# Patient Record
Sex: Female | Born: 1950 | Race: White | Hispanic: No | Smoking: Former smoker
Health system: Southern US, Community
[De-identification: ages and names within clinical notes are randomized; demographics above are authoritative.]

## PROBLEM LIST (undated history)

## (undated) DIAGNOSIS — M1990 Unspecified osteoarthritis, unspecified site: Secondary | ICD-10-CM

## (undated) DIAGNOSIS — T7840XA Allergy, unspecified, initial encounter: Secondary | ICD-10-CM

## (undated) DIAGNOSIS — Z973 Presence of spectacles and contact lenses: Secondary | ICD-10-CM

## (undated) DIAGNOSIS — M47812 Spondylosis without myelopathy or radiculopathy, cervical region: Secondary | ICD-10-CM

## (undated) DIAGNOSIS — K219 Gastro-esophageal reflux disease without esophagitis: Secondary | ICD-10-CM

## (undated) DIAGNOSIS — Z9889 Other specified postprocedural states: Secondary | ICD-10-CM

## (undated) DIAGNOSIS — E559 Vitamin D deficiency, unspecified: Secondary | ICD-10-CM

## (undated) DIAGNOSIS — I10 Essential (primary) hypertension: Secondary | ICD-10-CM

## (undated) DIAGNOSIS — I341 Nonrheumatic mitral (valve) prolapse: Secondary | ICD-10-CM

## (undated) DIAGNOSIS — K6289 Other specified diseases of anus and rectum: Secondary | ICD-10-CM

## (undated) DIAGNOSIS — M5030 Other cervical disc degeneration, unspecified cervical region: Secondary | ICD-10-CM

## (undated) DIAGNOSIS — Z8616 Personal history of COVID-19: Secondary | ICD-10-CM

## (undated) DIAGNOSIS — Z87820 Personal history of traumatic brain injury: Secondary | ICD-10-CM

## (undated) DIAGNOSIS — E042 Nontoxic multinodular goiter: Secondary | ICD-10-CM

## (undated) DIAGNOSIS — Z87898 Personal history of other specified conditions: Secondary | ICD-10-CM

## (undated) DIAGNOSIS — F419 Anxiety disorder, unspecified: Secondary | ICD-10-CM

## (undated) DIAGNOSIS — R112 Nausea with vomiting, unspecified: Secondary | ICD-10-CM

## (undated) DIAGNOSIS — K602 Anal fissure, unspecified: Secondary | ICD-10-CM

## (undated) DIAGNOSIS — K649 Unspecified hemorrhoids: Secondary | ICD-10-CM

## (undated) DIAGNOSIS — M359 Systemic involvement of connective tissue, unspecified: Secondary | ICD-10-CM

## (undated) HISTORY — PX: SUPRACERVICAL ABDOMINAL HYSTERECTOMY: SHX5393

## (undated) HISTORY — PX: COLONOSCOPY WITH PROPOFOL: SHX5780

## (undated) HISTORY — DX: Personal history of COVID-19: Z86.16

## (undated) HISTORY — DX: Anxiety disorder, unspecified: F41.9

## (undated) HISTORY — DX: Anal fissure, unspecified: K60.2

## (undated) HISTORY — DX: Vitamin D deficiency, unspecified: E55.9

## (undated) HISTORY — PX: UPPER GASTROINTESTINAL ENDOSCOPY: SHX188

## (undated) HISTORY — DX: Allergy, unspecified, initial encounter: T78.40XA

## (undated) HISTORY — DX: Essential (primary) hypertension: I10

## (undated) HISTORY — DX: Nonrheumatic mitral (valve) prolapse: I34.1

## (undated) HISTORY — PX: PELVIC FLOOR REPAIR: SHX2192

## (undated) HISTORY — PX: HEMORRHOID SURGERY: SHX153

## (undated) HISTORY — PX: TONSILLECTOMY AND ADENOIDECTOMY: SHX28

## (undated) HISTORY — PX: COLOSTOMY: SHX63

## (undated) HISTORY — DX: Gastro-esophageal reflux disease without esophagitis: K21.9

---
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
## 2003-06-15 HISTORY — PX: HEMORRHOID SURGERY: SHX153

## 2004-06-14 HISTORY — PX: SUPRACERVICAL ABDOMINAL HYSTERECTOMY: SHX5393

## 2014-12-31 ENCOUNTER — Ambulatory Visit (INDEPENDENT_AMBULATORY_CARE_PROVIDER_SITE_OTHER): Payer: BLUE CROSS/BLUE SHIELD | Admitting: Nurse Practitioner

## 2014-12-31 ENCOUNTER — Encounter: Payer: Self-pay | Admitting: Nurse Practitioner

## 2014-12-31 VITALS — BP 98/60 | HR 72 | Ht 63.0 in | Wt 129.2 lb

## 2014-12-31 DIAGNOSIS — R110 Nausea: Secondary | ICD-10-CM

## 2014-12-31 DIAGNOSIS — R1013 Epigastric pain: Secondary | ICD-10-CM | POA: Diagnosis not present

## 2014-12-31 NOTE — Patient Instructions (Signed)

## 2015-01-01 ENCOUNTER — Encounter: Payer: Self-pay | Admitting: Nurse Practitioner

## 2015-01-01 DIAGNOSIS — R110 Nausea: Secondary | ICD-10-CM | POA: Insufficient documentation

## 2015-01-01 DIAGNOSIS — R1013 Epigastric pain: Secondary | ICD-10-CM | POA: Insufficient documentation

## 2015-01-01 NOTE — Progress Notes (Signed)
Agree with initial assessment and plans 

## 2015-01-01 NOTE — Progress Notes (Signed)
HPI :  Patient is a 64 year old female referred practice by PCP. One week ago she developed nausea and nonradiating epigastric pain. Pain started off as a hunger type pain. It is dull during the day, becomes sharp after eating. Patient seen at [facility] ED a few days ago.CBC, LFTs and lipase were normal. No imaging was done. Patient was given a prescription for Carafate and Zantac. She was already on a daily PPI for chronic GERD. Three days prior to the onset of pain patient had taken ibuprofen but no NSAID use on a regular basis prior to that.  Patient states she was in the midst of being referred to [place] for colon cancer screening.Marland Kitchen Her last colonoscopy was done by Dr. Algis Greenhouse in [place].   Past Medical History  Diagnosis Date  . Anal fissure   . Anxiety   . Hypertension     Family History  Problem Relation Age of Onset  . Breast cancer Mother   . Diabetes Mother   . Breast cancer Maternal Aunt   . Prostate cancer Father   . Diabetes Father   . Heart disease Father   . Prostate cancer Brother   . Heart attack Brother    History  Substance Use Topics  . Smoking status: Former Research scientist (life sciences)  . Smokeless tobacco: Not on file  . Alcohol Use: 1.2 oz/week    2 Standard drinks or equivalent per week   Current Outpatient Prescriptions  Medication Sig Dispense Refill  . escitalopram (LEXAPRO) 10 MG tablet Take 5 mg by mouth daily.    Marland Kitchen esomeprazole (NEXIUM) 40 MG capsule Take 40 mg by mouth daily at 12 noon.    . irbesartan-hydrochlorothiazide (AVALIDE) 150-12.5 MG per tablet Take 1 tablet by mouth daily.    . ranitidine (ZANTAC) 150 MG tablet Take 150 mg by mouth 2 (two) times daily.    . sucralfate (CARAFATE) 1 GM/10ML suspension Take 1 g by mouth 4 (four) times daily -  with meals and at bedtime.     No current facility-administered medications for this visit.   Allergies  Allergen Reactions  . Contrast Media [Iodinated Diagnostic Agents] Other (See Comments)    sneezing      Review of Systems: Positive for back pain and fatigue. All other systems reviewed and negative except where noted in HPI.   Physical Exam: BP 98/60 mmHg  Pulse 72  Ht 5\' 3"  (1.6 m)  Wt 129 lb 4 oz (58.627 kg)  BMI 22.90 kg/m2 Constitutional: Pleasant,well-developed, white female in no acute distress. HEENT: Normocephalic and atraumatic. Conjunctivae are normal. No scleral icterus. Neck supple.  Cardiovascular: Normal rate, regular rhythm.  Pulmonary/chest: Effort normal and breath sounds normal. No wheezing, rales or rhonchi. Abdominal: Soft, nondistended, mild epigastric tenderness where there is some mild fullness. Bowel sounds active throughout. There are no masses palpable. No hepatomegaly. Extremities: no edema Lymphadenopathy: No cervical adenopathy noted. Neurological: Alert and oriented to person place and time. Skin: Skin is warm and dry. No rashes noted. Psychiatric: Normal mood and affect. Behavior is normal.   ASSESSMENT AND PLAN:  41. 64 year old female referred for nausea and epigastric pain, worse with meals. Recent labs at [facility] ED unremarkable. For further evaluation patient will be scheduled for upper endoscopy.The benefits, risks, and potential complications of EGD with possible biopsies were discussed with the patient and she agrees to proceed. If EGD is negative and pain persists then will consider imaging. Of note patient did have an ultrasound June 2013 for epigastric  pain. No acute findings. She also had a normal HIDA scan around the same time  2. Colon cancer screening. Will need to determine when she is due for colonoscopy. Will request records from Dr. Algis Greenhouse in [place], [place]    Cc: Margaretha Sheffield, MD Blain Pais, FNP

## 2015-01-06 ENCOUNTER — Encounter: Payer: Self-pay | Admitting: *Deleted

## 2015-01-06 ENCOUNTER — Ambulatory Visit (AMBULATORY_SURGERY_CENTER): Payer: BLUE CROSS/BLUE SHIELD | Admitting: Internal Medicine

## 2015-01-06 ENCOUNTER — Encounter: Payer: Self-pay | Admitting: Internal Medicine

## 2015-01-06 VITALS — BP 120/54 | HR 68 | Temp 97.8°F | Resp 24 | Ht 63.0 in | Wt 129.0 lb

## 2015-01-06 DIAGNOSIS — R1013 Epigastric pain: Secondary | ICD-10-CM

## 2015-01-06 HISTORY — PX: UPPER GASTROINTESTINAL ENDOSCOPY: SHX188

## 2015-01-06 MED ORDER — SODIUM CHLORIDE 0.9 % IV SOLN: 500.0000 mL | INTRAVENOUS | Status: DC

## 2015-01-06 NOTE — Op Note (Signed)
[facility]  [address] [place], [address]   ENDOSCOPY PROCEDURE REPORT  PATIENT: Carol Scott, Carol Scott  MR#: [id number] BIRTHDATE: 28-Jun-1950 , 63  yrs. old GENDER: female ENDOSCOPIST: Eustace Quail, MD REFERRED BY:  .  Self / Office PROCEDURE DATE:  01/06/2015 PROCEDURE:  EGD, diagnostic ASA CLASS:     Class II INDICATIONS:  epigastric pain. . Patient reports significant improvement in her symptom since initiating Carafate. MEDICATIONS: Monitored anesthesia care and Propofol 100 mg IV TOPICAL ANESTHETIC: none  DESCRIPTION OF PROCEDURE: After the risks benefits and alternatives of the procedure were thoroughly explained, informed consent was obtained.  The LB [id number] endoscope was introduced through the mouth and advanced to the second portion of the duodenum , Without limitations.  The instrument was slowly withdrawn as the mucosa was fully examined.  No images due to technical difficulties      EXAM: The esophagus and gastroesophageal junction were completely normal in appearance.  The stomach was entered and closely examined.The antrum, angularis, and lesser curvature were well visualized, including a retroflexed view of the cardia and fundus. The stomach wall was normally distensable.  The scope passed easily through the pylorus into the duodenum.  Retroflexed views revealed no abnormalities.     The scope was then withdrawn from the patient and the procedure completed.  COMPLICATIONS: There were no immediate complications.  ENDOSCOPIC IMPRESSION: 1. Normal EGD   RECOMMENDATIONS: 1. Continue current medications 2. Follow-up as needed  REPEAT EXAM:  eSigned:  Eustace Quail, MD 01/06/2015 11:00 AM    CC:The Patient  ; Quillian Quince Pomposini,MD [facility])

## 2015-01-06 NOTE — Progress Notes (Signed)
Patient ID: Carol Scott, female   DOB: 1951-02-26, 64 y.o.   MRN: [id number] Procedure report mailed to pt/KW

## 2015-01-06 NOTE — Progress Notes (Signed)
No egg or soy allergy No issues with past sedation

## 2015-01-06 NOTE — Progress Notes (Signed)
A/ox3, pleased with MAC, report to RN 

## 2015-01-06 NOTE — Patient Instructions (Signed)
Normal exam today. Resume current medications.   YOU HAD AN ENDOSCOPIC PROCEDURE TODAY AT [facility]:   Refer to the procedure report that was given to you for any specific questions about what was found during the examination.  If the procedure report does not answer your questions, please call your gastroenterologist to clarify.  If you requested that your care partner not be given the details of your procedure findings, then the procedure report has been included in a sealed envelope for you to review at your convenience later.  YOU SHOULD EXPECT: Some feelings of bloating in the abdomen. Passage of more gas than usual.  Walking can help get rid of the air that was put into your GI tract during the procedure and reduce the bloating. If you had a lower endoscopy (such as a colonoscopy or flexible sigmoidoscopy) you may notice spotting of blood in your stool or on the toilet paper. If you underwent a bowel prep for your procedure, you may not have a normal bowel movement for a few days.  Please Note:  You might notice some irritation and congestion in your nose or some drainage.  This is from the oxygen used during your procedure.  There is no need for concern and it should clear up in a day or so.  SYMPTOMS TO REPORT IMMEDIATELY:    Following upper endoscopy (EGD)  Vomiting of blood or coffee ground material  New chest pain or pain under the shoulder blades  Painful or persistently difficult swallowing  New shortness of breath  Fever of 100F or higher  Black, tarry-looking stools  For urgent or emergent issues, a gastroenterologist can be reached at any hour by calling [phone].   DIET: Your first meal following the procedure should be a small meal and then it is ok to progress to your normal diet. Heavy or fried foods are harder to digest and may make you feel nauseous or bloated.  Likewise, meals heavy in dairy and vegetables can increase bloating.  Drink plenty of  fluids but you should avoid alcoholic beverages for 24 hours.  ACTIVITY:  You should plan to take it easy for the rest of today and you should NOT DRIVE or use heavy machinery until tomorrow (because of the sedation medicines used during the test).    FOLLOW UP: Our staff will call the number listed on your records the next business day following your procedure to check on you and address any questions or concerns that you may have regarding the information given to you following your procedure. If we do not reach you, we will leave a message.  However, if you are feeling well and you are not experiencing any problems, there is no need to return our call.  We will assume that you have returned to your regular daily activities without incident.  If any biopsies were taken you will be contacted by phone or by letter within the next 1-3 weeks.  Please call us at [phone] if you have not heard about the biopsies in 3 weeks.    SIGNATURES/CONFIDENTIALITY: You and/or your care partner have signed paperwork which will be entered into your electronic medical record.  These signatures attest to the fact that that the information above on your After Visit Summary has been reviewed and is understood.  Full responsibility of the confidentiality of this discharge information lies with you and/or your care-partner.

## 2015-01-07 ENCOUNTER — Telehealth: Payer: Self-pay | Admitting: *Deleted

## 2015-01-07 NOTE — Telephone Encounter (Signed)
  Follow up Call-  Call back number 01/06/2015  Post procedure Call Back phone  # [phone]  Permission to leave phone message Yes     Patient questions:  Do you have a fever, pain , or abdominal swelling? No. Pain Score  0 *  Have you tolerated food without any problems? Yes.    Have you been able to return to your normal activities? Yes.    Do you have any questions about your discharge instructions: Diet   No. Medications  No. Follow up visit  No.  Do you have questions or concerns about your Care? No.  Actions: * If pain score is 4 or above: No action needed, pain <4.

## 2015-12-30 ENCOUNTER — Encounter: Payer: Self-pay | Admitting: Internal Medicine

## 2016-03-08 ENCOUNTER — Encounter: Payer: BLUE CROSS/BLUE SHIELD | Admitting: Internal Medicine

## 2017-02-24 ENCOUNTER — Ambulatory Visit: Payer: BLUE CROSS/BLUE SHIELD | Admitting: Nurse Practitioner

## 2017-03-08 ENCOUNTER — Ambulatory Visit: Payer: BLUE CROSS/BLUE SHIELD | Admitting: Nurse Practitioner

## 2017-03-10 ENCOUNTER — Encounter (INDEPENDENT_AMBULATORY_CARE_PROVIDER_SITE_OTHER): Payer: Self-pay

## 2017-03-10 ENCOUNTER — Ambulatory Visit (INDEPENDENT_AMBULATORY_CARE_PROVIDER_SITE_OTHER): Payer: Medicare Other | Admitting: Physician Assistant

## 2017-03-10 ENCOUNTER — Encounter: Payer: Self-pay | Admitting: Physician Assistant

## 2017-03-10 VITALS — BP 108/62 | HR 68 | Ht 64.0 in | Wt 136.0 lb

## 2017-03-10 DIAGNOSIS — R1013 Epigastric pain: Secondary | ICD-10-CM | POA: Diagnosis not present

## 2017-03-10 DIAGNOSIS — R110 Nausea: Secondary | ICD-10-CM | POA: Diagnosis not present

## 2017-03-10 DIAGNOSIS — Z1211 Encounter for screening for malignant neoplasm of colon: Secondary | ICD-10-CM | POA: Diagnosis not present

## 2017-03-10 MED ORDER — NA SULFATE-K SULFATE-MG SULF 17.5-3.13-1.6 GM/177ML PO SOLN: 1.0000 | ORAL | 0 refills | Status: DC

## 2017-03-10 MED ORDER — PANTOPRAZOLE SODIUM 40 MG PO TBEC: 40.0000 mg | DELAYED_RELEASE_TABLET | Freq: Every day | ORAL | 3 refills | Status: DC

## 2017-03-10 MED ORDER — METOCLOPRAMIDE HCL 10 MG PO TABS: 10.0000 mg | ORAL_TABLET | ORAL | 0 refills | Status: DC

## 2017-03-10 NOTE — Patient Instructions (Addendum)
Take your Reglan 30-60 minutes before each bowel prep.   You have been scheduled for an endoscopy and colonoscopy. Please follow the written instructions given to you at your visit today. Please pick up your prep supplies at the pharmacy within the next 1-3 days. If you use inhalers (even only as needed), please bring them with you on the day of your procedure. Your physician has requested that you go to www.startemmi.com and enter the access code given to you at your visit today. This web site gives a general overview about your procedure. However, you should still follow specific instructions given to you by our office regarding your preparation for the procedure.  Continue Zantac 150 mg at bedtime.   We have sent the following medications to your pharmacy for you to pick up at your convenience: Pantoprazole 40 mg 30- 60 mins before breakfast

## 2017-03-10 NOTE — Progress Notes (Signed)
Reviewed

## 2017-03-10 NOTE — Progress Notes (Signed)
Chief Complaint: Epigastric pain, nausea, screening for colorectal cancer  HPI:  Carol Scott is a 66 year old Caucasian female with a past medical history as listed below, who was referred to me by Pomposini, Cherly Anderson, MD for a complaint of epigastric pain and nausea as well as need for screening colonoscopy .    Patient was last seen in our clinic in 2016 for an EGD with Dr. Henrene Pastor due to epigastric pain and nausea. This was normal.    Today, the patient presents to clinic and explains that for the past couple of months she has felt nauseous after eating or even sometimes before " I get really hungry". Patient also describes that she has developed epigastric pain after eating on most days. Patient has been using only Zantac 150 mg at night before going to sleep and this does help with some of her symptoms including nighttime awakenings due to the nausea. Patient tells me she did stop her Nexium 40 mg about 10 days ago because she has been on this for "years" and didn't feel like it was helping anymore. Patient tells me there has been no change in her symptoms of epigastric pain and nausea over the past 10 days. Associated symptoms include bloating and a 5 pound weight gain.    Patient describes her last colonoscopy was at least 5 years ago if not 10. She believes Dr. Wynelle Cleveland did this, but her primary care provider has been telling her that she is due.    Patient denies fever, chills, blood in her stool, melena, weight loss, fatigue and anorexia, change in bowel habits, vomiting, heartburn or reflux.  Past Medical History:  Diagnosis Date  . Allergy   . Anal fissure   . Anxiety   . GERD (gastroesophageal reflux disease)   . Hypertension     Past Surgical History:  Procedure Laterality Date  . COLOSTOMY    . HEMORRHOID SURGERY    . PELVIC FLOOR REPAIR    . SUPRACERVICAL ABDOMINAL HYSTERECTOMY    . TONSILLECTOMY AND ADENOIDECTOMY    . UPPER GASTROINTESTINAL ENDOSCOPY      Outpatient  Encounter Prescriptions as of 03/10/2017  Medication Sig  . aspirin EC 81 MG tablet Take 81 mg by mouth daily.  . cetirizine (ZYRTEC) 10 MG tablet Take 10 mg by mouth daily.  Marland Kitchen escitalopram (LEXAPRO) 10 MG tablet Take 5 mg by mouth daily.  . irbesartan-hydrochlorothiazide (AVALIDE) 150-12.5 MG per tablet Take 1 tablet by mouth daily.  Marland Kitchen losartan (COZAAR) 100 MG tablet Take 100 mg by mouth daily.  . ranitidine (ZANTAC) 150 MG tablet Take 150 mg by mouth at bedtime.  . [DISCONTINUED] esomeprazole (NEXIUM) 40 MG capsule Take 40 mg by mouth daily at 12 noon.  . [DISCONTINUED] ranitidine (ZANTAC) 150 MG tablet Take 150 mg by mouth 2 (two) times daily.  . [DISCONTINUED] sucralfate (CARAFATE) 1 GM/10ML suspension Take 1 g by mouth 4 (four) times daily -  with meals and at bedtime.   No facility-administered encounter medications on file as of 03/10/2017.     Allergies as of 03/10/2017 - Review Complete 03/10/2017  Allergen Reaction Noted  . Contrast media [iodinated diagnostic agents] Other (See Comments) 12/31/2014    Family History  Problem Relation Age of Onset  . Breast cancer Mother   . Diabetes Mother   . Prostate cancer Father   . Diabetes Father   . Heart disease Father   . Prostate cancer Brother   . Breast cancer Maternal Aunt   .  Heart attack Brother   . Colon cancer Neg Hx   . Esophageal cancer Neg Hx   . Rectal cancer Neg Hx   . Stomach cancer Neg Hx     Social History   Social History  . Marital status: Widowed    Spouse name: N/A  . Number of children: 3  . Years of education: N/A   Occupational History  . retired    Social History Main Topics  . Smoking status: Former Research scientist (life sciences)  . Smokeless tobacco: Never Used  . Alcohol use 1.2 oz/week    2 Standard drinks or equivalent per week     Comment: socially  . Drug use: No  . Sexual activity: Not on file   Other Topics Concern  . Not on file   Social History Narrative  . No narrative on file    Review of  Systems:    Constitutional: No weight loss, fever, chills, weakness or fatigue Cardiovascular: No chest pain Respiratory: No SOB Gastrointestinal: See HPI and otherwise negative   Physical Exam:  Vital signs: BP 108/62   Pulse 68   Ht 5\' 4"  (1.626 m)   Wt 136 lb (61.7 kg)   BMI 23.34 kg/m   Constitutional:   Pleasant Caucasian female appears to be in NAD, Well developed, Well nourished, alert and cooperative Head:  Normocephalic and atraumatic. Eyes:   PEERL, EOMI. No icterus. Conjunctiva pink. Ears:  Normal auditory acuity. Neck:  Supple Throat: Oral cavity and pharynx without inflammation, swelling or lesion.  Respiratory: Respirations even and unlabored. Lungs clear to auscultation bilaterally.   No wheezes, crackles, or rhonchi.  Cardiovascular: Normal S1, S2. No MRG. Regular rate and rhythm. No peripheral edema, cyanosis or pallor.  Gastrointestinal:  Soft, nondistended, mild epigastric ttp. No rebound or guarding. Normal bowel sounds. No appreciable masses or hepatomegaly. Rectal:  Not performed.  Msk:  Symmetrical without gross deformities. Without edema, no deformity or joint abnormality.  Neurologic:  Alert and  oriented x4;  grossly normal neurologically.  Skin:   Dry and intact without significant lesions or rashes. Psychiatric: Demonstrates good judgement and reason without abnormal affect or behaviors.  See HPI regarding recent imaging/labs.  Assessment: 1. Epigastric pain: Better with Zantac, worse after eating or when patient is very hungry ;Likely related to gastritis/GERD 2. Nausea: With above 3. Colorectal cancer screening: Patient placed loss: Was at least 5-10 years ago where requesting records.  Plan: 1. Recommend the patient start Pantoprazole 40 mg daily, 30-60 minutes before breakfast. She may continue her Zantac daily at bedtime as well 2. Will request records from Dr. Wynelle Cleveland regarding last colonoscopy. Scheduled her for EGD as well as colonoscopy in  late November. Patient will have a return office visit in 3 weeks. If her epigastric pain and nausea are not relieved with recent additional of medication, then we will proceed with EGD, otherwise we can cancel this 3. Discussed all of the above with the patient including risks, benefits, limitations and alternatives and the patient agrees to proceed. 4. Patient requests Reglan prior to suprep dosing. Prescribed Reglan 5mg  #2, 20-60min before each prep. Directions discussed. 4. Reviewed antireflux diet and lifestyle modifications. 5. Patient to follow in clinic with me in 3 weeks.  [name], PA-C [facility] 03/10/2017, 1:24 PM  Cc: Pomposini, Cherly Anderson, MD

## 2017-03-10 NOTE — Progress Notes (Signed)
The note is below. JLL

## 2017-03-14 NOTE — Progress Notes (Signed)
Initial assessment and plans review

## 2017-05-13 ENCOUNTER — Encounter: Payer: Medicare Other | Admitting: Internal Medicine

## 2017-06-20 ENCOUNTER — Other Ambulatory Visit: Payer: Self-pay | Admitting: Physician Assistant

## 2017-07-24 ENCOUNTER — Other Ambulatory Visit: Payer: Self-pay | Admitting: Physician Assistant

## 2017-08-29 ENCOUNTER — Other Ambulatory Visit: Payer: Self-pay | Admitting: Internal Medicine

## 2017-09-20 ENCOUNTER — Ambulatory Visit (INDEPENDENT_AMBULATORY_CARE_PROVIDER_SITE_OTHER): Payer: Medicare Other | Admitting: Internal Medicine

## 2017-09-20 ENCOUNTER — Encounter: Payer: Self-pay | Admitting: Internal Medicine

## 2017-09-20 VITALS — BP 120/70 | HR 84 | Ht 64.0 in | Wt 136.0 lb

## 2017-09-20 DIAGNOSIS — K649 Unspecified hemorrhoids: Secondary | ICD-10-CM

## 2017-09-20 DIAGNOSIS — K219 Gastro-esophageal reflux disease without esophagitis: Secondary | ICD-10-CM | POA: Diagnosis not present

## 2017-09-20 DIAGNOSIS — K625 Hemorrhage of anus and rectum: Secondary | ICD-10-CM | POA: Diagnosis not present

## 2017-09-20 NOTE — Patient Instructions (Signed)

## 2017-09-20 NOTE — Progress Notes (Signed)
HISTORY OF PRESENT ILLNESS:  Carol Scott is a pleasant 67 y.o. female ho presents herself regarding symptomatic hemorrhoids, rectal bleeding, difficulty with bowel habits, and the need for colonoscopy. Patient has had her GI care several places. Upper endoscopy performed here July 2016 to evaluate epigastric pain was normal. Last seen in the office by the GI physician assistant September 2018 complaining of epigastric pain, nausea, and the need for colon cancer screening. Several recommendations were made including follow-up which apparently did not occur. Patient tells me that she had colonoscopy greater than 5 years ago but probably less than 10. Feels she is due for follow-up. She may have what sounds like rectal prolapse for which she was seen by a colorectal surgeon Dr. Sheryn Bison at [facility]. She does move her bowels daily but describes an obstructing type sensation. In addition to the aforementioned symptoms she does have some acid reflux for which she takes pantoprazole. No dysphagia. No abdominal pain.  REVIEW OF SYSTEMS:  All non-GI ROS negative unless otherwise stated in the history of present illness except for allergy, anxiety  Past Medical History:  Diagnosis Date  . Allergy   . Anal fissure   . Anxiety   . GERD (gastroesophageal reflux disease)   . Hypertension     Past Surgical History:  Procedure Laterality Date  . COLOSTOMY    . HEMORRHOID SURGERY    . PELVIC FLOOR REPAIR    . SUPRACERVICAL ABDOMINAL HYSTERECTOMY    . TONSILLECTOMY AND ADENOIDECTOMY    . UPPER GASTROINTESTINAL ENDOSCOPY      Social History [name]  reports that she has quit smoking. She has never used smokeless tobacco. She reports that she drinks about 1.2 oz of alcohol per week. She reports that she does not use drugs.  family history includes Breast cancer in her maternal aunt and mother; Diabetes in her father and mother; Heart attack in her brother; Heart disease in her father; Prostate cancer in  her brother and father.  Allergies  Allergen Reactions  . Contrast Media [Iodinated Diagnostic Agents] Other (See Comments)    sneezing  . Prednisone        PHYSICAL EXAMINATION: Vital signs: BP 120/70   Pulse 84   Ht 5\' 4"  (1.626 m)   Wt 136 lb (61.7 kg)   BMI 23.34 kg/m   Constitutional: generally well-appearing, no acute distress Psychiatric: alert and oriented x3, cooperative Eyes: extraocular movements intact, anicteric, conjunctiva pink Mouth: oral pharynx moist, no lesions Neck: supple no lymphadenopathy Cardiovascular: heart regular rate and rhythm, no murmur Lungs: clear to auscultation bilaterally Abdomen: soft, nontender, nondistended, no obvious ascites, no peritoneal signs, normal bowel sounds, no organomegaly Rectal:tight sphincter. Rubbery protruding internal hemorrhoids Extremities: no clubbing, cyanosis, or lower extremity edema bilaterally Skin: no lesions on visible extremities Neuro: No focal deficits. Cranial nerves intact  ASSESSMENT:  #1. Rectal bleeding #2. Difficulties with bowels. Question prolapse #3. Symptomatic hemorrhoids with prolapse #4. GERD. On pantoprazole   PLAN:  #1. Schedule colonoscopy to evaluate rectal bleeding.The nature of the procedure, as well as the risks, benefits, and alternatives were carefully and thoroughly reviewed with the patient. Ample time for discussion and questions allowed. The patient understood, was satisfied, and agreed to proceed. #2. Colorectal surgery evaluation for symptomatic large hemorrhoids post colonoscopy #3. Reflux precautions #4. Continue pantoprazole to control reflux symptoms

## 2017-11-14 ENCOUNTER — Telehealth: Payer: Self-pay | Admitting: Internal Medicine

## 2017-11-14 NOTE — Telephone Encounter (Signed)
noted 

## 2017-11-17 ENCOUNTER — Encounter: Payer: Medicare Other | Admitting: Internal Medicine

## 2017-12-04 ENCOUNTER — Other Ambulatory Visit: Payer: Self-pay | Admitting: Internal Medicine

## 2017-12-22 ENCOUNTER — Encounter: Payer: Self-pay | Admitting: Internal Medicine

## 2017-12-22 ENCOUNTER — Ambulatory Visit (INDEPENDENT_AMBULATORY_CARE_PROVIDER_SITE_OTHER): Payer: Medicare Other | Admitting: Internal Medicine

## 2017-12-22 VITALS — BP 128/74 | HR 66 | Ht 64.0 in | Wt 134.0 lb

## 2017-12-22 DIAGNOSIS — K219 Gastro-esophageal reflux disease without esophagitis: Secondary | ICD-10-CM | POA: Diagnosis not present

## 2017-12-22 DIAGNOSIS — K625 Hemorrhage of anus and rectum: Secondary | ICD-10-CM | POA: Diagnosis not present

## 2017-12-22 DIAGNOSIS — K649 Unspecified hemorrhoids: Secondary | ICD-10-CM

## 2017-12-22 MED ORDER — NA SULFATE-K SULFATE-MG SULF 17.5-3.13-1.6 GM/177ML PO SOLN: 1.0000 | Freq: Once | ORAL | 0 refills | Status: AC

## 2017-12-22 NOTE — Patient Instructions (Signed)

## 2017-12-22 NOTE — Progress Notes (Signed)
HISTORY OF PRESENT ILLNESS:  Carol Scott is a 67 y.o. female who scheduled this appointment after having been seen in this office 09/20/2017. At that time the clinical impression was rectal bleeding, difficulty with bowel habits with possible prolapse, symptomatic hemorrhoids with prolapse on physical exam that day, and well controlled GERD on PPI. The plan was to schedule colonoscopy and colorectal surgery evaluation for hemorrhoids. Patient subsequently contact this office saying that she was having "pelvic scan" and wanted to wait on the aforementioned recommendations. She states that she presents today for "follow-up on her scan". The patient apparently had pelvic pain after relieving this office. No complaints of pain at the time of her evaluation. She is thinking that we ordered the scan, but we did not. She's not sure who ordered the scan. She thinks maybe her PCP. In any event, reviewing outside records from her PCP shows a CT scan of the abdomen and pelvis with contrast 11/14/2017. She was found to have a tiny nonobstructing right renal stone. Otherwise normal. She tells me that she has no further pain. She continues with symptomatic hemorrhoids. Last colonoscopy at least 5 or 10 years ago. She is not certain. She did have normal upper endoscopy here in 2016. In addition to reviewing the outside CT scan as mentioned, outside blood work shows normal CBC with differential and normal hepatic function tests. She does tell me that she wonders if she has an abdominal hernia.  REVIEW OF SYSTEMS:  All non-GI ROS negative except for sinus and allergy, arthritis, urinary frequency  Past Medical History:  Diagnosis Date  . Allergy   . Anal fissure   . Anxiety   . GERD (gastroesophageal reflux disease)   . Hypertension     Past Surgical History:  Procedure Laterality Date  . COLOSTOMY    . HEMORRHOID SURGERY    . PELVIC FLOOR REPAIR    . SUPRACERVICAL ABDOMINAL HYSTERECTOMY    . TONSILLECTOMY  AND ADENOIDECTOMY    . UPPER GASTROINTESTINAL ENDOSCOPY      Social History Carol Scott  reports that she has quit smoking. She has never used smokeless tobacco. She reports that she drinks about 1.2 oz of alcohol per week. She reports that she does not use drugs.  family history includes Breast cancer in her maternal aunt and mother; Diabetes in her father and mother; Heart attack in her brother; Heart disease in her father; Prostate cancer in her brother and father.  Allergies  Allergen Reactions  . Contrast Media [Iodinated Diagnostic Agents] Other (See Comments)    sneezing  . Prednisone        PHYSICAL EXAMINATION: Vital signs: BP 128/74   Pulse 66   Ht 5\' 4"  (1.626 m)   Wt 134 lb (60.8 kg)   BMI 23.00 kg/m   Constitutional: generally well-appearing, no acute distress Psychiatric: alert and oriented x3, cooperative Eyes: extraocular movements intact, anicteric, conjunctiva pink Mouth: oral pharynx moist, no lesions Neck: supple no lymphadenopathy Cardiovascular: heart regular rate and rhythm, no murmur Lungs: clear to auscultation bilaterally Abdomen: soft, nontender, nondistended, no obvious ascites, no peritoneal signs, normal bowel sounds, no organomegaly. No hernia Rectal:omitted. Please see rectal examination from April 2019 Extremities: no clubbing, cyanosis, or lower extremity edema bilaterally Skin: no lesions on visible extremities Neuro: No focal deficits. Cranial nerves intact  ASSESSMENT:  #1. Intermittent rectal bleeding #2. Prolapsed large hemorrhoids #3. GERD on PPI with negative upper endoscopy 2016 #4. Transient lower abdominal/pelvic pain. Negative CT 11/14/2017. Pain resolved  PLAN:  #1. Schedule colonoscopy as previously recommended.The nature of the procedure, as well as the risks, benefits, and alternatives were carefully and thoroughly reviewed with the patient. Ample time for discussion and questions allowed. The patient understood, was  satisfied, and agreed to proceed. #2. Colorectal surgery referral for large symptomatic prolapsing hemorrhoids. Patient requests being seen in [place] #3. Continue reflux precautions and PPI #4. Ongoing general medical care with PCP  25 minutes spent face-to-face with the patient. Greater than 50% a time use for counseling regarding the above listed diagnoses and subsequent recommendations.

## 2018-01-04 ENCOUNTER — Encounter: Payer: Self-pay | Admitting: Internal Medicine

## 2018-01-04 ENCOUNTER — Ambulatory Visit (AMBULATORY_SURGERY_CENTER): Payer: Medicare Other | Admitting: Internal Medicine

## 2018-01-04 VITALS — BP 132/68 | HR 66 | Temp 97.3°F | Resp 13 | Ht 64.0 in | Wt 134.0 lb

## 2018-01-04 DIAGNOSIS — K625 Hemorrhage of anus and rectum: Secondary | ICD-10-CM

## 2018-01-04 DIAGNOSIS — K921 Melena: Secondary | ICD-10-CM

## 2018-01-04 DIAGNOSIS — D123 Benign neoplasm of transverse colon: Secondary | ICD-10-CM

## 2018-01-04 MED ORDER — SODIUM CHLORIDE 0.9 % IV SOLN: 500.0000 mL | Freq: Once | INTRAVENOUS | Status: DC

## 2018-01-04 NOTE — Progress Notes (Signed)
I have reviewed the patient's medical history in detail and updated the computerized patient record.

## 2018-01-04 NOTE — Progress Notes (Signed)
To PAcu, Pt awake and alert, Report to RN DRM

## 2018-01-04 NOTE — Op Note (Signed)
[facility] Patient Name: Carol Scott Procedure Date: 01/04/2018 8:43 AM MRN: [id number] Endoscopist: Docia Chuck. Henrene Pastor , MD Age: 67 Referring MD:  Date of Birth: May 28, 1951 Gender: Female Account #: [id number] Procedure:                Colonoscopy, With cold snare polypectomy x 1 Indications:              Rectal bleeding Medicines:                Monitored Anesthesia Care Procedure:                Pre-Anesthesia Assessment:                           - Prior to the procedure, a History and Physical                            was performed, and patient medications and                            allergies were reviewed. The patient's tolerance of                            previous anesthesia was also reviewed. The risks                            and benefits of the procedure and the sedation                            options and risks were discussed with the patient.                            All questions were answered, and informed consent                            was obtained. Prior Anticoagulants: The patient has                            taken no previous anticoagulant or antiplatelet                            agents. ASA Grade Assessment: II - A patient with                            mild systemic disease. After reviewing the risks                            and benefits, the patient was deemed in                            satisfactory condition to undergo the procedure.                           After obtaining informed consent, the colonoscope  was passed under direct vision. Throughout the                            procedure, the patient's blood pressure, pulse, and                            oxygen saturations were monitored continuously. The                            Colonoscope was introduced through the anus and                            advanced to the the cecum, identified by                            appendiceal orifice and  ileocecal valve. The                            ileocecal valve, appendiceal orifice, and rectum                            were photographed. The quality of the bowel                            preparation was excellent. The colonoscopy was                            performed without difficulty. The patient tolerated                            the procedure well. The bowel preparation used was                            SUPREP. Scope In: 9:15:41 AM Scope Out: 9:39:26 AM Scope Withdrawal Time: 0 hours 15 minutes 21 seconds  Total Procedure Duration: 0 hours 23 minutes 45 seconds  Findings:                 A 4 mm polyp was found in the hepatic flexure. The                            polyp was sessile. The polyp was removed with a                            cold snare. Resection and retrieval were complete.                           Non-bleeding prolapsed internal hemorrhoids were                            found during retroflexion. The hemorrhoids were                            large.  The exam was otherwise without abnormality on                            direct and retroflexion views. Complications:            No immediate complications. Estimated blood loss:                            None. Estimated Blood Loss:     Estimated blood loss: none. Impression:               - One 4 mm polyp at the hepatic flexure, removed                            with a cold snare. Resected and retrieved.                           - Non-bleeding prolapsed internal hemorrhoids.                           - The examination was otherwise normal on direct                            and retroflexion views. Recommendation:           - Repeat colonoscopy in 5-10 years for surveillance.                           - Patient has a contact number available for                            emergencies. The signs and symptoms of potential                            delayed complications were  discussed with the                            patient. Return to normal activities tomorrow.                            Written discharge instructions were provided to the                            patient.                           - Resume previous diet.                           - Continue present medications.                           - Await pathology results.                           - General surgery referral Dr. [name]                            "  symptomatic prolapsing hemorrhoids" Docia Chuck. Henrene Pastor, MD 01/04/2018 9:45:10 AM This report has been signed electronically.

## 2018-01-04 NOTE — Progress Notes (Signed)
Called to room to assist during endoscopic procedure.  Patient ID and intended procedure confirmed with present staff. Received instructions for my participation in the procedure from the performing physician.  

## 2018-01-04 NOTE — Patient Instructions (Signed)
Discharge instructions given. Handouts on polyps and Hemorrhoids. Resume previous medications. General surgery referral Dr. [name] symptomatic prolapsing hemorrhoids. YOU HAD AN ENDOSCOPIC PROCEDURE TODAY AT [facility]:   Refer to the procedure report that was given to you for any specific questions about what was found during the examination.  If the procedure report does not answer your questions, please call your gastroenterologist to clarify.  If you requested that your care partner not be given the details of your procedure findings, then the procedure report has been included in a sealed envelope for you to review at your convenience later.  YOU SHOULD EXPECT: Some feelings of bloating in the abdomen. Passage of more gas than usual.  Walking can help get rid of the air that was put into your GI tract during the procedure and reduce the bloating. If you had a lower endoscopy (such as a colonoscopy or flexible sigmoidoscopy) you may notice spotting of blood in your stool or on the toilet paper. If you underwent a bowel prep for your procedure, you may not have a normal bowel movement for a few days.  Please Note:  You might notice some irritation and congestion in your nose or some drainage.  This is from the oxygen used during your procedure.  There is no need for concern and it should clear up in a day or so.  SYMPTOMS TO REPORT IMMEDIATELY:   Following lower endoscopy (colonoscopy or flexible sigmoidoscopy):  Excessive amounts of blood in the stool  Significant tenderness or worsening of abdominal pains  Swelling of the abdomen that is new, acute  Fever of 100F or higher   For urgent or emergent issues, a gastroenterologist can be reached at any hour by calling [phone].   DIET:  We do recommend a small meal at first, but then you may proceed to your regular diet.  Drink plenty of fluids but you should avoid alcoholic beverages for 24  hours.  ACTIVITY:  You should plan to take it easy for the rest of today and you should NOT DRIVE or use heavy machinery until tomorrow (because of the sedation medicines used during the test).    FOLLOW UP: Our staff will call the number listed on your records the next business day following your procedure to check on you and address any questions or concerns that you may have regarding the information given to you following your procedure. If we do not reach you, we will leave a message.  However, if you are feeling well and you are not experiencing any problems, there is no need to return our call.  We will assume that you have returned to your regular daily activities without incident.  If any biopsies were taken you will be contacted by phone or by letter within the next 1-3 weeks.  Please call us at [phone] if you have not heard about the biopsies in 3 weeks.    SIGNATURES/CONFIDENTIALITY: You and/or your care partner have signed paperwork which will be entered into your electronic medical record.  These signatures attest to the fact that that the information above on your After Visit Summary has been reviewed and is understood.  Full responsibility of the confidentiality of this discharge information lies with you and/or your care-partner.

## 2018-01-05 ENCOUNTER — Telehealth: Payer: Self-pay

## 2018-01-05 NOTE — Telephone Encounter (Incomplete)
  Follow up Call-  Call back number 01/04/2018  Post procedure Call Back phone  # [phone]  Permission to leave phone message Yes  Some recent data might be hidden     Patient questions:  Do you have a fever, pain , or abdominal swelling? No. Pain Score  0 *  Have you tolerated food without any problems? Yes.    Have you been able to return to your normal activities? Yes.    Do you have any questions about your discharge instructions: Diet   No. Medications  No. Follow up visit  No.  Do you have questions or concerns about your Care? No.  Actions: * If pain score is 4 or above: {ACTION; [facility] ENDO PAIN >4:21563::"No action needed, pain <4."}

## 2018-01-10 ENCOUNTER — Encounter: Payer: Self-pay | Admitting: Internal Medicine

## 2018-03-06 ENCOUNTER — Other Ambulatory Visit: Payer: Self-pay

## 2018-03-06 ENCOUNTER — Encounter: Payer: Self-pay | Admitting: Physical Therapy

## 2018-03-06 ENCOUNTER — Ambulatory Visit: Payer: Medicare Other | Attending: Surgery | Admitting: Physical Therapy

## 2018-03-06 DIAGNOSIS — M62838 Other muscle spasm: Secondary | ICD-10-CM

## 2018-03-06 DIAGNOSIS — M6281 Muscle weakness (generalized): Secondary | ICD-10-CM | POA: Diagnosis present

## 2018-03-06 DIAGNOSIS — R278 Other lack of coordination: Secondary | ICD-10-CM | POA: Diagnosis present

## 2018-03-06 DIAGNOSIS — M545 Low back pain, unspecified: Secondary | ICD-10-CM

## 2018-03-06 NOTE — Patient Instructions (Signed)
Quick Contraction: Gravity Eliminated (Side-Lying)    Lie on left side, hips and knees slightly bent. Quickly squeeze then fully relax pelvic floor. Perform __1_ sets of _5__. Rest for _1__ seconds between sets. Do _3__ times a day.   Copyright  VHI. All rights reserved.   Slow Contraction: Gravity Eliminated (Side-Lying)    Lie on left side, hips and knees slightly bent. Slowly squeeze pelvic floor for _5__ seconds. Rest for _10__ seconds. Repeat 10___ times. Do __3_ times a day.  Do not contract the buttocks Copyright  VHI. All rights reserved.   after a bowel movement contract the anus for 1 seconds  [facility] [address] [place], [place] [address] Phone # [phone] Fax [phone]

## 2018-03-06 NOTE — Therapy (Signed)
[facility] [address] [place], [place], [address] Phone: [phone]   Fax:  [phone]  Physical Therapy Evaluation  Patient Details  Name: Carol Scott MRN: [id number] Date of Birth: September 12, 1950 Referring Provider: Dr. [name]   Encounter Date: 03/06/2018  PT End of Session - 03/06/18 1154    Visit Number  1    Date for PT Re-Evaluation  05/01/18    Authorization Type  Medicare BCBS    PT Start Time  1100    PT Stop Time  1145    PT Time Calculation (min)  45 min    Activity Tolerance  Patient tolerated treatment well    Behavior During Therapy  [facility] for tasks assessed/performed       Past Medical History:  Diagnosis Date  . Allergy   . Anal fissure   . Anxiety   . GERD (gastroesophageal reflux disease)   . Hypertension     Past Surgical History:  Procedure Laterality Date  . COLOSTOMY    . HEMORRHOID SURGERY    . PELVIC FLOOR REPAIR    . SUPRACERVICAL ABDOMINAL HYSTERECTOMY    . TONSILLECTOMY AND ADENOIDECTOMY    . UPPER GASTROINTESTINAL ENDOSCOPY      There were no vitals filed for this visit.   Subjective Assessment - 03/06/18 1108    Subjective  Patient has had a chronic condition with trying to have a bowel movement. Since the hysterectomy and pelvic floor repair she has had to splint by pressing the outside of her body to have a bowel movment. Patient has 1 bowel movement per day.  Patient has to strain to have a bowel mvoement. No fecal leakage. No urinary leakage.  Pai nwith bowel movement due to the fissure. Patient is using a topical ointment for the fissure.     Patient Stated Goals  not have to splint to have a bowel movement, change in posture due to bowel movements    Currently in Pain?  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 [name]    Onset Date/Surgical Date  06/14/05    Prior Therapy  none      Precautions   Precautions  None      Restrictions   Weight Bearing Restrictions  No      Balance Screen   Has the patient fallen in the past 6 months  No    Has the patient had a decrease in activity level because of a fear of falling?   No    Is the patient reluctant to leave their home because of a fear of falling?   No      Home Film/video editor residence      Prior  Function   Level of Independence  Independent    Vocation  Retired    Leisure  yoga, walk      Cognition   Overall Cognitive Status  Within Functional Limits for tasks assessed      ROM / Strength   AROM / PROM / Strength  AROM;PROM;Strength      AROM   Overall AROM Comments  lumbar ROM is full      Strength   Left Hip Extension  4/5    Left Hip External Rotation  4/5    Left Hip Internal Rotation  4/5      Palpation   SI assessment   left ilium is rotated posteriorly;     Palpation comment  tenderness located on the right abdomen; tenderness located on right diaphgram      Special Tests    Special Tests  Sacrolliac Tests    Sacroiliac Tests   Pelvic Compression      Pelvic Compression   Findings  Positive    Side  Left    comment  pain                Objective measurements completed on examination: See above findings.    Pelvic Floor Special Questions - 03/06/18 0001    Prior Pregnancies  Yes    Urinary Leakage  No    Fecal incontinence  No    Skin Integrity  Hemorroids    External Palpation  firmness around the  rectum; protrusion of the hemorroid, skin around the rectum is dark and protrudes outward    Pelvic Floor Internal Exam  Patient confirmed identificaiton and approves PT to access the pelvic floor and treatment    Exam Type  Rectal    Palpation  tenderness along the anterior portion of the rectum; initial pelvic floor contraction was the gluteals then needed tactile cues to isolate the muscle.    Strength  Flicker   after tactile cues and gentle Soft tissue work 2/5   Strength # of reps  5    Strength # of seconds  5    Tone  increased               PT Education - 03/06/18 1154    Education Details  contraction of the pelvic floor muscles in sidely    Person(s) Educated  Patient    Methods  Explanation;Demonstration;Verbal cues;Handout    Comprehension  Returned demonstration;Verbalized understanding       PT Short Term Goals - 03/06/18 1205      PT SHORT TERM GOAL #1   Title  independent with initial HEP    Time  4    Period  Weeks    Status  New    Target Date  04/03/18      PT SHORT TERM GOAL #2   Title  ability to isolate anal sphincter contraction without tactile cues    Time  4    Period  Weeks    Status  New    Target Date  04/03/18      PT SHORT TERM GOAL #3   Title  lumbar pain with bowel movements decreased >/= 25%    Time  4    Period  Weeks    Status  New    Target Date  04/03/18      PT SHORT TERM GOAL #4   Title  understand how to toilet correctly without straining the pelvic floor  Time  4    Period  Weeks    Status  New    Target Date  04/03/18        PT Long Term Goals - 03/06/18 1143      PT LONG TERM GOAL #1   Title  independent with HEP and understand how to progress    Time  8    Period  Weeks    Status  New    Target Date  05/01/18      PT LONG TERM GOAL #2   Title  ability to have a bowel movement without straining using correct posture and ability to relax the pelvic floor    Time  8    Period  Weeks    Status  New     Target Date  05/01/18      PT LONG TERM GOAL #3   Title  ability to have a bowel movment without splinting with her fingers to promote correct movement of the anal sphincer    Time  8    Period  Weeks    Status  New    Target Date  05/01/18      PT LONG TERM GOAL #4   Title  lumbar pain with bowel movements decreased >/= 75% due to not have to splint the pelvic floor    Time  8    Period  Weeks    Status  New    Target Date  05/01/18             Plan - 03/06/18 1156    Clinical Impression Statement  Patient is a 67 year old with pelvic floor dysfuunction for years.  Patient has had a Supracervical Abdominal Hysterectomy and pelvic floor repair in 2006.  Patient reports intermittent back pain at level 4/10 when trying ot have a bowel movement. Patient has full lumbar ROM with palpable tenderness located on right lumbar paraspinals. Left ilium is rotated posteriorly.  Positive SI joint compression with pain on the left.  Bilateral hip extension is 4/5. Rectal strength is 1/5 initially with gluteal squeeze. After soft tissue work and tactile cues she was able to isolate the anal sphincter and strength was 2/5.  Tenderness located on the anterior half of the anal sphincter. Firmness felt on the exterior sphincter with protrusion.  Large hemorroid is protuding outward. Patient is using cream on her fissure and it is helping in the rectal area.  Patient reports she has to strain and splint to have a bowel movement. Patient will benefit from skilled therapy to improve pelvic floor dysfunction and back pain.     History and Personal Factors relevant to plan of care:  Anal fissure; s/p supracervical abdominal hysterectomy with pelvic floor repair 2006    Clinical Presentation  Evolving    Clinical Presentation due to:  back pain with activities, splint and strain to have bowel movement    Clinical Decision Making  Moderate    Rehab Potential  Excellent    Clinical Impairments Affecting Rehab  Potential  Anal fissure; s/p supracervical abdominal hysterectomy with pelvic floor repair 2006    PT Frequency  1x / week    PT Duration  8 weeks    PT Treatment/Interventions  Biofeedback;Therapeutic activities;Therapeutic exercise;Patient/family education;Neuromuscular re-education;Manual techniques;Dry needling    PT Next Visit Plan  hip stretches; lumbar mobility exercises with hip stretches; toileting techniques; abdominal soft tissue work and around the rectum, diaphgramatic breathing, pelvic floor strength  Consulted and Agree with Plan of Care  Patient       Patient will benefit from skilled therapeutic intervention in order to improve the following deficits and impairments:  Pain, Increased fascial restricitons, Decreased coordination, Decreased mobility, Increased muscle spasms, Impaired tone, Decreased activity tolerance, Decreased endurance, Decreased range of motion, Decreased strength, Prosthetic Dependency  Visit Diagnosis: Muscle weakness (generalized) - Plan: PT plan of care cert/re-cert  Other lack of coordination - Plan: PT plan of care cert/re-cert  Acute low back pain without sciatica, unspecified back pain laterality - Plan: PT plan of care cert/re-cert  Other muscle spasm - Plan: PT plan of care cert/re-cert     Problem List Patient Active Problem List   Diagnosis Date Noted  . Nausea without vomiting 01/01/2015  . Abdominal pain, epigastric 01/01/2015    Earlie Counts, PT 03/06/18 12:10 PM   [facility] [address] [place], [place], [address] Phone: [phone]   Fax:  [phone]  Name: Carol Scott MRN: [id number] Date of Birth: 09/07/1950

## 2018-03-14 ENCOUNTER — Encounter: Payer: Medicare Other | Admitting: Physical Therapy

## 2018-03-21 ENCOUNTER — Encounter: Payer: Self-pay | Admitting: Physical Therapy

## 2018-03-21 ENCOUNTER — Ambulatory Visit: Payer: Medicare Other | Attending: Surgery | Admitting: Physical Therapy

## 2018-03-21 DIAGNOSIS — M545 Low back pain, unspecified: Secondary | ICD-10-CM

## 2018-03-21 DIAGNOSIS — R278 Other lack of coordination: Secondary | ICD-10-CM | POA: Insufficient documentation

## 2018-03-21 DIAGNOSIS — M6281 Muscle weakness (generalized): Secondary | ICD-10-CM | POA: Diagnosis not present

## 2018-03-21 DIAGNOSIS — M62838 Other muscle spasm: Secondary | ICD-10-CM | POA: Insufficient documentation

## 2018-03-21 NOTE — Therapy (Signed)
[facility] [address] [place], [place], [address] Phone: [phone]   Fax:  [phone]  Physical Therapy Treatment  Patient Details  Name: Carol Scott MRN: [id number] Date of Birth: 1950/09/11 Referring Provider (PT): Dr. [name]   Encounter Date: 03/21/2018  PT End of Session - 03/21/18 1233    Visit Number  2    Date for PT Re-Evaluation  05/01/18    Authorization Type  Medicare BCBS    PT Start Time  1230    PT Stop Time  1310    PT Time Calculation (min)  40 min    Activity Tolerance  Patient tolerated treatment well    Behavior During Therapy  [facility] for tasks assessed/performed       Past Medical History:  Diagnosis Date  . Allergy   . Anal fissure   . Anxiety   . GERD (gastroesophageal reflux disease)   . Hypertension     Past Surgical History:  Procedure Laterality Date  . COLOSTOMY    . HEMORRHOID SURGERY    . PELVIC FLOOR REPAIR    . SUPRACERVICAL ABDOMINAL HYSTERECTOMY    . TONSILLECTOMY AND ADENOIDECTOMY    . UPPER GASTROINTESTINAL ENDOSCOPY      There were no vitals filed for this visit.  Subjective Assessment - 03/21/18 1234    Subjective  Fissure was healing well then go off track and back on not. Pain with bowel movement is 70% better. Bowel movements are not easier to do. After performing the pelvic floor I felt sore in the lwoer abdominals.     Patient Stated Goals  not have to splint to have a bowel movement, change in posture due to bowel movements    Currently in Pain?  Yes    Pain Score  4     Pain Location  Back    Pain Orientation  Lower    Pain Descriptors / Indicators  Sharp    Pain Type  Chronic pain    Pain Onset  More than a month ago    Pain Frequency  Intermittent    Aggravating Factors   shifting to have a bowel movement, lifting grandchildren    Pain Relieving Factors  ice, alleve    Multiple Pain Sites  No                        [facility] Adult PT Treatment/Exercise - 03/21/18 0001      Exercises   Exercises  Lumbar      Lumbar Exercises: Stretches   Active Hamstring Stretch  Right;Left;1 rep;30 seconds   sitting   Single Knee to Chest Stretch  Right;Left;1 rep;30 seconds    Lower Trunk Rotation  2 reps;30 seconds   supine   Quadruped Mid Back Stretch  3 reps;30 seconds   all 3 directions   Piriformis Stretch  Right;Left;1 rep;30 seconds   sitting   Other Lumbar Stretch Exercise  pelvic tilt in sitting    Other Lumbar Stretch Exercise  happy baby hold 30 sec      Lumbar Exercises: Quadruped   Madcat/Old Horse  15 reps      Manual Therapy   Manual Therapy  Joint mobilization;Muscle Energy Technique;Soft tissue mobilization    Joint Mobilization  sacral mobilization to correct right rotation    Soft tissue mobilization  abdominal massage to tissue mobility, increased mobiity of the inteetines, and increaset mobility of the diaphgram.  Muscle Energy Technique  correct right ilium             PT Education - 03/21/18 1316    Education Details  Access Code: [email or web address]     Person(s) Educated  Patient    Methods  Explanation;Demonstration;Verbal cues;Handout    Comprehension  Returned demonstration;Verbalized understanding       PT Short Term Goals - 03/21/18 1321      PT SHORT TERM GOAL #1   Title  independent with initial HEP    Time  4    Period  Weeks    Status  On-going      PT SHORT TERM GOAL #2   Title  ability to isolate anal sphincter contraction without tactile cues    Time  4    Period  Weeks    Status  On-going      PT SHORT TERM GOAL #3   Title  lumbar pain with bowel movements decreased >/= 25%    Time  4    Period  Weeks    Status  On-going      PT SHORT TERM GOAL #4   Title  understand how to toilet correctly without straining the pelvic floor    Time  4    Period  Weeks    Status  On-going        PT Long Term Goals - 03/06/18  1143      PT LONG TERM GOAL #1   Title  independent with HEP and understand how to progress    Time  8    Period  Weeks    Status  New    Target Date  05/01/18      PT LONG TERM GOAL #2   Title  ability to have a bowel movement without straining using correct posture and ability to relax the pelvic floor    Time  8    Period  Weeks    Status  New    Target Date  05/01/18      PT LONG TERM GOAL #3   Title  ability to have a bowel movment without splinting with her fingers to promote correct movement of the anal sphincer    Time  8    Period  Weeks    Status  New    Target Date  05/01/18      PT LONG TERM GOAL #4   Title  lumbar pain with bowel movements decreased >/= 75% due to not have to splint the pelvic floor    Time  8    Period  Weeks    Status  New    Target Date  05/01/18            Plan - 03/21/18 1316    Clinical Impression Statement  Patient pelvis was in correct alignment after manual work. Patient continues to have to strain with a bowel movement. Patient has improve abdominal mobility after soft tissue work. Patient was able to perform diaphragmatic breathing with increased abdominal protrusion. Patient just started therapy so she has not met goals yet. Patient will benefit from skilled therapy to improve pelvic floor dysfunction and back pain.     Rehab Potential  Excellent    Clinical Impairments Affecting Rehab Potential  Anal fissure; s/p supracervical abdominal hysterectomy with pelvic floor repair 2006    PT Frequency  1x / week    PT Duration  8 weeks    PT Treatment/Interventions  Biofeedback;Therapeutic activities;Therapeutic  exercise;Patient/family education;Neuromuscular re-education;Manual techniques;Dry needling    PT Next Visit Plan   lumbar mobility exercises with hip stretches; toileting techniques; abdominal soft tissue work and around the rectum, diaphgramatic breathing, pelvic floor strength with EMG    PT Home Exercise Plan  Access Code:  [email or web address]     Recommended Other Services  MD signed the initial eval    Consulted and Agree with Plan of Care  Patient       Patient will benefit from skilled therapeutic intervention in order to improve the following deficits and impairments:  Pain, Increased fascial restricitons, Decreased coordination, Decreased mobility, Increased muscle spasms, Impaired tone, Decreased activity tolerance, Decreased endurance, Decreased range of motion, Decreased strength, Prosthetic Dependency  Visit Diagnosis: Muscle weakness (generalized)  Other lack of coordination  Acute low back pain without sciatica, unspecified back pain laterality  Other muscle spasm     Problem List Patient Active Problem List   Diagnosis Date Noted  . Nausea without vomiting 01/01/2015  . Abdominal pain, epigastric 01/01/2015    Earlie Counts, PT 03/21/18 1:22 PM   [facility] [address] [place], [place], [address] Phone: [phone]   Fax:  [phone]  Name: Carol Scott MRN: [id number] Date of Birth: 24-Mar-1951

## 2018-03-21 NOTE — Patient Instructions (Signed)
Access Code: [email or web address]  URL: [email or web address]  Date: 03/21/2018  Prepared by: Earlie Counts   Exercises  Seated Hamstring Stretch - 10 reps - 3 sets - 1x daily - 7x weekly  Seated Hamstring Stretch - 2 reps - 1 sets - 30 seconds hold - 1x daily - 7x weekly  Seated Piriformis Stretch with Trunk Bend - 2 reps - 1 sets - 30 sec hold - 1x daily - 7x weekly  Seated Pelvic Tilt - 10 reps - 1 sets - 1x daily - 7x weekly  Cat-Camel - 10 reps - 1 sets - 1x daily - 7x weekly  Child's Pose Stretch - 1 reps - 1 sets - 30 sec hold - 1x daily - 7x weekly  Child's Pose with Sidebending - 2 reps - 1 sets - 30 sec hold - 1x daily - 7x weekly  Static Prone on Elbows - 3 reps - 1 sets - 5 sec hold - 1x daily - 7x weekly  Supine Single Knee to Chest Stretch - 2 reps - 1 sets - 30 sec hold - 1x daily - 7x weekly  Supine Pelvic Floor Stretch - 1 reps - 1 sets - 1 min hold - 1x daily - 7x weekly  Supine Lower Trunk Rotation - 2 reps - 1 sets - 30 sec hold - 1x daily - 7x weekly  [facility] [address], [place],  [address] Phone # [phone] Fax [phone]

## 2018-03-28 ENCOUNTER — Ambulatory Visit: Payer: Medicare Other | Admitting: Physical Therapy

## 2018-03-28 ENCOUNTER — Encounter: Payer: Self-pay | Admitting: Physical Therapy

## 2018-03-28 DIAGNOSIS — M545 Low back pain, unspecified: Secondary | ICD-10-CM

## 2018-03-28 DIAGNOSIS — M6281 Muscle weakness (generalized): Secondary | ICD-10-CM | POA: Diagnosis not present

## 2018-03-28 DIAGNOSIS — M62838 Other muscle spasm: Secondary | ICD-10-CM

## 2018-03-28 DIAGNOSIS — R278 Other lack of coordination: Secondary | ICD-10-CM

## 2018-03-28 NOTE — Therapy (Signed)
[facility] [address] [place], [place], [address] Phone: [phone]   Fax:  [phone]  Physical Therapy Treatment  Patient Details  Name: Ridhima Golberg MRN: [id number] Date of Birth: 08-29-1950 Referring Provider (PT): Dr. [name]   Encounter Date: 03/28/2018  PT End of Session - 03/28/18 1320    Visit Number  3    Date for PT Re-Evaluation  05/01/18    Authorization Type  Medicare BCBS    PT Start Time  1230    PT Stop Time  1310    PT Time Calculation (min)  40 min    Activity Tolerance  Patient tolerated treatment well    Behavior During Therapy  [facility] for tasks assessed/performed       Past Medical History:  Diagnosis Date  . Allergy   . Anal fissure   . Anxiety   . GERD (gastroesophageal reflux disease)   . Hypertension     Past Surgical History:  Procedure Laterality Date  . COLOSTOMY    . HEMORRHOID SURGERY    . PELVIC FLOOR REPAIR    . SUPRACERVICAL ABDOMINAL HYSTERECTOMY    . TONSILLECTOMY AND ADENOIDECTOMY    . UPPER GASTROINTESTINAL ENDOSCOPY      There were no vitals filed for this visit.  Subjective Assessment - 03/28/18 1231    Subjective  I have not been able to have a bowel movement without pushing with my hands.  My back has been hurting. No pain with bowel movements.     Patient Stated Goals  not have to splint to have a bowel movement, change in posture due to bowel movements    Currently in Pain?  Yes    Pain Score  7     Pain Location  Back    Pain Orientation  Lower    Pain Descriptors / Indicators  Sharp    Pain Type  Chronic pain    Pain Onset  More than a month ago    Pain Frequency  Intermittent    Aggravating Factors   sitting to have a bowel movement, lifting grandchildren    Pain Relieving Factors  ice, alleve    Multiple Pain Sites  No         [facility] PT Assessment - 03/28/18 0001      Palpation   SI assessment   left ilium is rotated  posteriorly; sacrum rotated left                Pelvic Floor Special Questions - 03/28/18 0001    Pelvic Floor Internal Exam  Patient confirmed identificaiton and approves PT to access the pelvic floor and treatment    Exam Type  Rectal        [facility] Adult PT Treatment/Exercise - 03/28/18 0001      Therapeutic Activites    Therapeutic Activities  ADL's    ADL's  correct toileting technique to expel her bowels      Manual Therapy   Manual Therapy  Muscle Energy Technique;Joint mobilization;Soft tissue mobilization;Myofascial release    Joint Mobilization  correct sacurm    Soft tissue mobilization  left quadratus, left lumbar paraspinals, diaphgram, left abdominals    Myofascial Release  release of 3 planes of fascia in the diaphgram     Muscle Energy Technique  correct right ilium       Trigger Point Dry Needling - 03/28/18 1314    Consent Given?  Yes    Education  Handout Provided  Yes    Muscles Treated Upper Body  --   left multifidi L1-L5; elongation of tissue          PT Education - 03/28/18 1313    Education Details  toileting technique; abdominal massage; information of dry needling    Person(s) Educated  Patient    Methods  Explanation;Demonstration;Verbal cues;Handout    Comprehension  Returned demonstration;Verbalized understanding       PT Short Term Goals - 03/28/18 1403      PT SHORT TERM GOAL #1   Title  independent with initial HEP    Time  4    Period  Weeks    Status  Achieved      PT SHORT TERM GOAL #2   Title  ability to isolate anal sphincter contraction without tactile cues    Time  4    Period  Weeks    Status  On-going      PT SHORT TERM GOAL #3   Title  lumbar pain with bowel movements decreased >/= 25%    Baseline  increased the past several days    Time  4    Status  On-going      PT SHORT TERM GOAL #4   Title  understand how to toilet correctly without straining the pelvic floor    Time  4    Period  Weeks     Status  On-going        PT Long Term Goals - 03/06/18 1143      PT LONG TERM GOAL #1   Title  independent with HEP and understand how to progress    Time  8    Period  Weeks    Status  New    Target Date  05/01/18      PT LONG TERM GOAL #2   Title  ability to have a bowel movement without straining using correct posture and ability to relax the pelvic floor    Time  8    Period  Weeks    Status  New    Target Date  05/01/18      PT LONG TERM GOAL #3   Title  ability to have a bowel movment without splinting with her fingers to promote correct movement of the anal sphincer    Time  8    Period  Weeks    Status  New    Target Date  05/01/18      PT LONG TERM GOAL #4   Title  lumbar pain with bowel movements decreased >/= 75% due to not have to splint the pelvic floor    Time  8    Period  Weeks    Status  New    Target Date  05/01/18            Plan - 03/28/18 1236    Clinical Impression Statement  Patient has no pain with bowel movements.  Patient has increased lumbar pain for the past several days. Patient continues to have to strain and use her hands to have a bowel movement. Patient was able to expand her abdomen better after manual work.  Patient understand how to massage her abdomen to stimulate bowel movement. Patient will benefit from skilled therapy to improve bowels  and coordination.     Rehab Potential  Excellent    PT Frequency  1x / week    PT Duration  8 weeks    PT Treatment/Interventions  Biofeedback;Therapeutic activities;Therapeutic exercise;Patient/family education;Neuromuscular re-education;Manual techniques;Dry needling    PT Next Visit Plan   lumbar mobility exercises with hip stretches;  abdominal soft tissue work and around the rectum, assess dry needling;  pelvic floor strength with EMG    PT Home Exercise Plan  Access Code: [email or web address]     Consulted and Agree with Plan of Care  Patient       Patient will benefit from skilled therapeutic  intervention in order to improve the following deficits and impairments:  Pain, Increased fascial restricitons, Decreased coordination, Decreased mobility, Increased muscle spasms, Impaired tone, Decreased activity tolerance, Decreased endurance, Decreased range of motion, Decreased strength, Prosthetic Dependency  Visit Diagnosis: Muscle weakness (generalized)  Other lack of coordination  Acute low back pain without sciatica, unspecified back pain laterality  Other muscle spasm     Problem List Patient Active Problem List   Diagnosis Date Noted  . Nausea without vomiting 01/01/2015  . Abdominal pain, epigastric 01/01/2015    Earlie Counts, PT 03/28/18 2:05 PM   [facility] [address] [place], [place], [address] Phone: [phone]   Fax:  [phone]  Name: Jaretzi Droz MRN: [id number] Date of Birth: 04/26/1951

## 2018-03-28 NOTE — Patient Instructions (Signed)
Trigger Point Dry Needling  . What is Trigger Point Dry Needling (DN)? o DN is a physical therapy technique used to treat muscle pain and dysfunction. Specifically, DN helps deactivate muscle trigger points (muscle knots).  o A thin filiform needle is used to penetrate the skin and stimulate the underlying trigger point. The goal is for a local twitch response (LTR) to occur and for the trigger point to relax. No medication of any kind is injected during the procedure.   . What Does Trigger Point Dry Needling Feel Like?  o The procedure feels different for each individual patient. Some patients report that they do not actually feel the needle enter the skin and overall the process is not painful. Very mild bleeding may occur. However, many patients feel a deep cramping in the muscle in which the needle was inserted. This is the local twitch response.   . How Will I feel after the treatment? o Soreness is normal, and the onset of soreness may not occur for a few hours. Typically this soreness does not last longer than two days.  o Bruising is uncommon, however; ice can be used to decrease any possible bruising.  o In rare cases feeling tired or nauseous after the treatment is normal. In addition, your symptoms may get worse before they get better, this period will typically not last longer than 24 hours.   . What Can I do After My Treatment? o Increase your hydration by drinking more water for the next 24 hours. o You may place ice or heat on the areas treated that have become sore, however, do not use heat on inflamed or bruised areas. Heat often brings more relief post needling. o You can continue your regular activities, but vigorous activity is not recommended initially after the treatment for 24 hours. o DN is best combined with other physical therapy such as strengthening, stretching, and other therapies.    [facility] [address], [address] [place], [place]  [address] Phone # [phone] Fax [phone] About Abdominal Massage  Abdominal massage, also called external colon massage, is a self-treatment circular massage technique that can reduce and eliminate gas and ease constipation. The colon naturally contracts in waves in a clockwise direction starting from inside the right hip, moving up toward the ribs, across the belly, and down inside the left hip.  When you perform circular abdominal massage, you help stimulate your colon's normal wave pattern of movement called peristalsis.  It is most beneficial when done after eating.  Positioning You can practice abdominal massage with oil while lying down, or in the shower with soap.  Some people find that it is just as effective to do the massage through clothing while sitting or standing.  How to Massage Start by placing your finger tips or knuckles on your right side, just inside your hip bone.  . Make small circular movements while you move upward toward your rib cage.   . Once you reach the bottom right side of your rib cage, take your circular movements across to the left side of the bottom of your rib cage.  . Next, move downward until you reach the inside of your left hip bone.  This is the path your feces travel in your colon. . Continue to perform your abdominal massage in this pattern for 10 minutes each day.     You can apply as much pressure as is comfortable in your massage.  Start gently and build pressure as you   continue to practice.  Notice any areas of pain as you massage; areas of slight pain may be relieved as you massage, but if you have areas of significant or intense pain, consult with your healthcare provider.  Other Considerations . General physical activity including bending and stretching can have a beneficial massage-like effect on the colon.  Deep breathing can also stimulate the colon because breathing deeply activates the same nervous system that supplies the colon.    . Abdominal massage should always be used in combination with a bowel-conscious diet that is high in the proper type of fiber for you, fluids (primarily water), and a regular exercise program. Toileting Techniques for Bowel Movements (Defecation) Using your belly (abdomen) and pelvic floor muscles to have a bowel movement is usually instinctive.  Sometimes people can have problems with these muscles and have to relearn proper defecation (emptying) techniques.  If you have weakness in your muscles, organs that are falling out, decreased sensation in your pelvis, or ignore your urge to go, you may find yourself straining to have a bowel movement.  You are straining if you are: . holding your breath or taking in a huge gulp of air and holding it  . keeping your lips and jaw tensed and closed tightly . turning red in the face because of excessive pushing or forcing . developing or worsening your  hemorrhoids . getting faint while pushing . not emptying completely and have to defecate many times a day  If you are straining, you are actually making it harder for yourself to have a bowel movement.  Many people find they are pulling up with the pelvic floor muscles and closing off instead of opening the anus. Due to lack pelvic floor relaxation and coordination the abdominal muscles, one has to work harder to push the feces out.  Many people have never been taught how to defecate efficiently and effectively.  Notice what happens to your body when you are having a bowel movement.  While you are sitting on the toilet pay attention to the following areas: . Jaw and mouth position . Angle of your hips   . Whether your feet touch the ground or not . Arm placement  . Spine position . Waist . Belly tension . Anus (opening of the anal canal)  An Evacuation/Defecation Plan   Here are the 4 basic points:  1. Lean forward enough for your elbows to rest on your knees 2. Support your feet on the floor or use  a low stool if your feet don't touch the floor  3. Push out your belly as if you have swallowed a beach ball-you should feel a widening of your waist 4. Open and relax your pelvic floor muscles, rather than tightening around the anus      The following conditions my require modifications to your toileting posture:  . If you have had surgery in the past that limits your back, hip, pelvic, knee or ankle flexibility . Constipation   Your healthcare practitioner may make the following additional suggestions and adjustments:  1) Sit on the toilet  a) Make sure your feet are supported. b) Notice your hip angle and spine position-most people find it effective to lean forward or raise their knees, which can help the muscles around the anus to relax  c) When you lean forward, place your forearms on your thighs for support  2) Relax suggestions a) Breath deeply in through your nose and out slowly through your mouth as   if you are smelling the flowers and blowing out the candles. b) To become aware of how to relax your muscles, contracting and releasing muscles can be helpful.  Pull your pelvic floor muscles in tightly by using the image of holding back gas, or closing around the anus (visualize making a circle smaller) and lifting the anus up and in.  Then release the muscles and your anus should drop down and feel open. Repeat 5 times ending with the feeling of relaxation. c) Keep your pelvic floor muscles relaxed; let your belly bulge out. d) The digestive tract starts at the mouth and ends at the anal opening, so be sure to relax both ends of the tube.  Place your tongue on the roof of your mouth with your teeth separated.  This helps relax your mouth and will help to relax the anus at the same time.  3) Empty (defecation) a) Keep your pelvic floor and sphincter relaxed, then bulge your anal muscles.  Make the anal opening wide.  b) Stick your belly out as if you have swallowed a beach  ball. c) Make your belly wall hard using your belly muscles while continuing to breathe. Doing this makes it easier to open your anus. d) Breath out and give a grunt (or try using other sounds such as ahhhh, shhhhh, ohhhh or grrrrrrr).  4) Finish a) As you finish your bowel movement, pull the pelvic floor muscles up and in.  This will leave your anus in the proper place rather than remaining pushed out and down. If you leave your anus pushed out and down, it will start to feel as though that is normal and give you incorrect signals about needing to have a bowel movement.    Davied Nocito, PT [facility] [address] [place], [place] [address]  

## 2018-04-04 ENCOUNTER — Encounter: Payer: Self-pay | Admitting: Physical Therapy

## 2018-04-04 ENCOUNTER — Ambulatory Visit: Payer: Medicare Other | Admitting: Physical Therapy

## 2018-04-04 DIAGNOSIS — M62838 Other muscle spasm: Secondary | ICD-10-CM

## 2018-04-04 DIAGNOSIS — R278 Other lack of coordination: Secondary | ICD-10-CM

## 2018-04-04 DIAGNOSIS — M6281 Muscle weakness (generalized): Secondary | ICD-10-CM

## 2018-04-04 DIAGNOSIS — M545 Low back pain, unspecified: Secondary | ICD-10-CM

## 2018-04-04 NOTE — Therapy (Addendum)
[facility] [address] [place], [place], [address] Phone: [phone]   Fax:  [phone]  Physical Therapy Treatment  Patient Details  Name: Carol Scott MRN: [id number] Date of Birth: 1950/07/19 Referring Provider (PT): Dr. [name]   Encounter Date: 04/04/2018  PT End of Session - 04/04/18 1316    Visit Number  4    Date for PT Re-Evaluation  05/01/18    Authorization Type  Medicare BCBS    PT Start Time  1230    PT Stop Time  1310    PT Time Calculation (min)  40 min    Activity Tolerance  Patient tolerated treatment well    Behavior During Therapy  [facility] for tasks assessed/performed       Past Medical History:  Diagnosis Date  . Allergy   . Anal fissure   . Anxiety   . GERD (gastroesophageal reflux disease)   . Hypertension     Past Surgical History:  Procedure Laterality Date  . COLOSTOMY    . HEMORRHOID SURGERY    . PELVIC FLOOR REPAIR    . SUPRACERVICAL ABDOMINAL HYSTERECTOMY    . TONSILLECTOMY AND ADENOIDECTOMY    . UPPER GASTROINTESTINAL ENDOSCOPY      There were no vitals filed for this visit.  Subjective Assessment - 04/04/18 1232    Subjective  The fissure is bleeding agin. I am having pain again with blood. I have been using the ointment the doctor gave me. I saw the doctor last week. MD wants me to use the pelvic floor EMG.     Patient Stated Goals  not have to splint to have a bowel movement, change in posture due to bowel movements    Currently in Pain?  Yes    Pain Score  3     Pain Location  Rectum    Pain Orientation  Mid    Pain Descriptors / Indicators  Sharp    Pain Type  Chronic pain    Pain Onset  More than a month ago    Pain Frequency  Intermittent    Aggravating Factors   sitting to have a bowel movement, lifting grandchildren    Pain Relieving Factors  ice alleve    Multiple Pain Sites  No                       [facility] Adult PT  Treatment/Exercise - 04/04/18 0001      Self-Care   Self-Care  Other Self-Care Comments    Other Self-Care Comments   information on vaginal lubricants and mositurizers for vaginal health      Exercises   Exercises  Lumbar      Lumbar Exercises: Supine   Clam  10 reps;1 second   yellow band around the knees   Clam Limitations  tactile cues to contract the lower abdominals with the anus    Other Supine Lumbar Exercises  hookly with ball squeeze hold 5 sec 10 times with tactile cues to contract the lower abdominal and anus      Manual Therapy   Manual Therapy  Myofascial release    Myofascial Release  releasing around the abdomen, along the lower quadrant releaseing the intestines from the fascia, around the umbilicus             PT Education - 04/04/18 1315    Education Details  information on vaginal moisturizers and lubricants; pelvic floor contraction  Person(s) Educated  Patient    Methods  Explanation;Demonstration;Verbal cues;Handout    Comprehension  Returned demonstration;Verbalized understanding       PT Short Term Goals - 04/04/18 1318      PT SHORT TERM GOAL #4   Title  understand how to toilet correctly without straining the pelvic floor    Time  4    Period  Weeks    Status  Achieved        PT Long Term Goals - 03/06/18 1143      PT LONG TERM GOAL #1   Title  independent with HEP and understand how to progress    Time  8    Period  Weeks    Status  New    Target Date  05/01/18      PT LONG TERM GOAL #2   Title  ability to have a bowel movement without straining using correct posture and ability to relax the pelvic floor    Time  8    Period  Weeks    Status  New    Target Date  05/01/18      PT LONG TERM GOAL #3   Title  ability to have a bowel movment without splinting with her fingers to promote correct movement of the anal sphincer    Time  8    Period  Weeks    Status  New    Target Date  05/01/18      PT LONG TERM GOAL #4    Title  lumbar pain with bowel movements decreased >/= 75% due to not have to splint the pelvic floor    Time  8    Period  Weeks    Status  New    Target Date  05/01/18            Plan - 04/04/18 1245    Clinical Impression Statement  Patient has a fissure that opened up so she now has pain with bowel movements.  Patient has difficulty with contracting the lower abdominal with the pelvic floor. Patient was not able to fully evacuate the stools today so she needs abdominal myofascial release to improve peristalic mobility of the intestines.  Patient still has to strain to have a bowel movement.  Patient will benefi tfrom skilled therapy to improve bowels and coordination.     Rehab Potential  Excellent    Clinical Impairments Affecting Rehab Potential  Anal fissure; s/p supracervical abdominal hysterectomy with pelvic floor repair 2006    PT Frequency  1x / week    PT Duration  8 weeks    PT Treatment/Interventions  Biofeedback;Therapeutic activities;Therapeutic exercise;Patient/family education;Neuromuscular re-education;Manual techniques;Dry needling    PT Next Visit Plan   lumbar mobility exercises with hip stretches;  abdominal soft tissue work and around the rectum,   pelvic floor strength with EMG    PT Home Exercise Plan  Access Code: [email or web address]     Consulted and Agree with Plan of Care  Patient       Patient will benefit from skilled therapeutic intervention in order to improve the following deficits and impairments:  Pain, Increased fascial restricitons, Decreased coordination, Decreased mobility, Increased muscle spasms, Impaired tone, Decreased activity tolerance, Decreased endurance, Decreased range of motion, Decreased strength, Prosthetic Dependency  Visit Diagnosis: Muscle weakness (generalized)  Other lack of coordination  Acute low back pain without sciatica, unspecified back pain laterality  Other muscle spasm     Problem List Patient Active Problem  List    Diagnosis Date Noted  . Nausea without vomiting 01/01/2015  . Abdominal pain, epigastric 01/01/2015    Earlie Counts, PT 04/04/18 1:19 PM   [facility] [address], [place], [place], [address] Phone: [phone]   Fax:  [phone]  Name: Teagyn Fishel MRN: [id number] Date of Birth: 1950/11/07 PHYSICAL THERAPY DISCHARGE SUMMARY  Visits from Start of Care: 4  Current functional level related to goals / functional outcomes: See above.    Remaining deficits: See above. Patient has not returned since her visit on 04/05/2018. Patient no-showed on 04/11/2018 and no phone call.    Education / Equipment: HEP Plan:                                                    Patient goals were not met. Patient is being discharged due to not returning since the last visit.  Thank you for the referral. Earlie Counts, PT 05/03/18 8:33 AM  ?????

## 2018-04-04 NOTE — Patient Instructions (Addendum)
Moisturizers . They are used in the vagina to hydrate the mucous membrane that make up the vaginal canal. . Designed to keep a more normal acid balance (ph) . Once placed in the vagina, it will last between two to three days.  . Use 2-3 times per week at bedtime and last longer than 60 min. . Ingredients to avoid is glycerin and fragrance, can increase chance of infection . Should not be used just before sex due to causing irritation . Most are gels administered either in a tampon-shaped applicator or as a vaginal suppository. They are non-hormonal.   Types of Moisturizers . Samul Dada- drug store . Vitamin E vaginal suppositories- Whole foods, Amazon . Moist Again . Coconut oil- can break down condoms . Julva- (Do no use if on Tamoxifen) amazon . Yes moisturizer- amazon . NeuEve Silk , NeuEve Silver for menopausal or over 65 (if have severe vaginal atrophy or cancer treatments use NeuEve Silk for  1 month than move to The Pepsi)- [employer], MapleFlower.dk . Olive and Bee intimate cream- www.oliveandbee.com.au . Mae vaginal moisturizer- Amazon  Creams to use externally on the Vulva area  Albertson's (good for for cancer patients that had radiation to the area)- [place] or [employer].FlyingBasics.com.br  V-magic cream - amazon  Julva-amazon  Vital "V [place] salve ( help moisturize and help with thinning vulvar area, does have [place] by Irwin Brakeman labial moisturizer (Amazon,   Coconut oil   Things to avoid in the vaginal area . Do not use things to irritate the vulvar area . No lotions just specialized creams for the vulva area- Neogyn, V-magic, No soaps; can use Aveeno or Calendula cleanser if needed. Must be gentle . No deodorants . No douches . Good to sleep without underwear to let the vaginal area to air out . No scrubbing: spread the lips to let warm water rinse over labias and pat  dry  Lubrication . Used for intercourse to reduce friction . Avoid ones that have glycerin, warming gels, tingling gels, icing or cooling gel, scented . Avoid parabens due to a preservative similar to female sex hormone . May need to be reapplied once or several times during sexual activity . Can be applied to both partners genitals prior to vaginal penetration to minimize friction or irritation . Prevent irritation and mucosal tears that cause post coital pain and increased the risk of vaginal and urinary tract infections . Oil-based lubricants cannot be used with condoms due to breaking them down.  Least likely to irritate vaginal tissue.  . Plant based-lubes are safe . Silicone-based lubrication are thicker and last long and used for post-menopausal women  Vaginal Lubricators Here is a list of some suggested lubricators you can use for intercourse. Use the most hypoallergenic product.  You can place on you or your partner.   Slippery Stuff  Sylk or Sliquid Natural H2O ( good  if frequent UTI's)  Blossom Organics (www.blossom-organics.com)  Luvena   Coconut oil  PJur Woman Nude- water based lubricant, amazon  Uberlube- Amazon  Aloe Vera  Yes lubricant- [employer] Platinum-Silicone, Target, Walgreens  Olive and Bee intimate cream-  www.oliveandbee.com.au Things to avoid in lubricants are glycerin, warming gels, tingling gels, icing or cooling  gels, and scented gels.  Also avoid Vaseline. KY jelly, Replens, and Astroglide kills good bacteria(lactobacilli)  Things to avoid in the vaginal area . Do not use things to  irritate the vulvar area . No lotions- see below . No soaps; can use Aveeno or Calendula cleanser if needed. Must be gentle . No deodorants . No douches . Good to sleep without underwear to let the vaginal area to air out . No scrubbing: spread the lips to let warm water rinse over labias and pat dry  Creams that can be used on the [place] Releveum or [place] [employer]vest Gele    Adduction: Hip - Knees Together With Pelvic Floor (Hook-Lying)    Lie with hips and knees bent, towel roll between knees. Squeeze pelvic floor while pushing knees together. Hold for _5__ seconds. Rest for _5__ seconds. Repeat _10__ times. Do _1__ times a day.   Copyright  VHI. All rights reserved.  External Rotation: Hip - Knees Apart With Pelvic Floor (Hook-Lying)    Lie with hips and knees bent, band tied just above knees. Squeeze pelvic floor while pulling knees apart. Hold for _2__ seconds. Rest for __2_ seconds. Repeat _10__ times. Do __1_ times a day.   Copyright  VHI. All rights reserved.  [facility] [address] [place], [place] [address] Phone # [phone] Fax [phone]

## 2018-04-11 ENCOUNTER — Encounter: Payer: Medicare Other | Admitting: Physical Therapy

## 2020-04-16 ENCOUNTER — Encounter (HOSPITAL_BASED_OUTPATIENT_CLINIC_OR_DEPARTMENT_OTHER): Payer: Self-pay | Admitting: Surgery

## 2020-04-17 ENCOUNTER — Ambulatory Visit: Payer: Self-pay | Admitting: Surgery

## 2020-04-17 NOTE — H&P (View-Only) (Signed)
CC: Long-term follow-up - recurrent anal fissure symptoms  HPI: Carol Scott is a very pleasant 39yoF with hx of HTN, GERD, connective tissue disorder (on hydroxychloroquine) whom had previously presented for evaluation of possible hemorrhoids. She reported a multiyear history of issues with "hemorrhoids" and having to splint with bowel movements. She was diagnosed with hemorrhoids in 2003 and underwent hemorrhoidectomy. She had recurrent issues with anal pain following all this and subsequently underwent an exam under anesthesia and was found to have a fissure. The physician that time also diagnosed her with some degree of uterine prolapse and she underwent a laparoscopic TAH/BSO for this. She had a "hemorrhoids" flare following her colonoscopy prep with Dr. Henrene Pastor which was performed 01/04/18 and notable for a small diminutive polyp at the hepatic flexure which came back as SSP as well as internal hemorrhoids. Following all this her symptoms slowly improved.  She has a history of 3 vaginal deliveries the first of which required an episiotomy. She describes some issues with liquid stool incontinence if she takes too much MiraLAX. She denies any incontinence to gas, liquid or solid stool - previously had been documented about possible liquid issues but she is adamant this isn't the case.  She does not report needing to use a finger in the vagina to have a bowel movement nor finger in the rectum to remove stool. She reports having some tissue on the outside of her anus as well   INTERVAL HX She had partially completed therapy with Earlie Counts and noted improvement in all of her pelvic complaints. She had tried topical applications of ointments including nifedipine gel for her anal fissure. She had resolution of her pains but has dealt with issues with recurrence intermittently since last being seen in office 2 years ago. We recommended she return for follow-up in approximately 3 months after  rinsing her but she was lost to follow-up and never returned. She returns today with ongoing issues with intermittent visual leg symptoms where she will have sharp knifelike pain with bowel movements a lot for couple of weeks and improved. She denies any issues with rectal prolapse and states that whenever his out is typically always on the outside and looks like a grape.  PMH: HTN (well controlled on oral antihypertensive), GERD (well controlled on PPI), connective tissue disorder (well controlled on hydroxychloroquine)  PSH: Lap TAH/BSO for prolapse; hemorrhoidectomy in 2003; anal examination anesthesia following this and diagnosed with fissure. Denies any other abdominal or anorectal operations  FHx: Denies FHx of malignancy  Social: Denies use of tobacco/drugs; social EtOH use; retired.  ROS: A comprehensive 10 system review of systems was completed with the patient and pertinent findings as noted above.  The patient is a 69 year old female.   Allergies [name], LPN; 79/08/9028 0:92 PM) Dyes  No Known Drug Allergies  [03/30/2018]: Allergies Reconciled   Medication History [name], LPN; 33/0/0762 2:63 PM) Escitalopram Oxalate (10MG  Tablet, Oral) Active. Azelastine HCl (0.1% Solution, Nasal) Active. Fluconazole (150MG  Tablet, Oral) Active. HydroCHLOROthiazide (25MG  Tablet, Oral) Active. Hydroxychloroquine Sulfate (200MG  Tablet, Oral) Active. Losartan Potassium (100MG  Tablet, Oral) Active. Montelukast Sodium (10MG  Tablet, Oral) Active. Pantoprazole Sodium (40MG  Tablet DR, Oral) Active. Vitamin D3 (50000UNIT Tablet, Oral) Active. Medications Reconciled    Review of Systems Harrell Gave M. Liese Dizdarevic MD; 04/14/2020 4:41 PM) General Not Present- Appetite Loss, Chills, Fatigue, Fever, Night Sweats, Weight Gain and Weight Loss. Skin Not Present- Change in Wart/Mole, Dryness, Hives, Jaundice, New Lesions, Non-Healing Wounds, Rash and Ulcer. HEENT  Present-  Seasonal Allergies and Wears glasses/contact lenses. Not Present- Earache, Hearing Loss, Hoarseness, Nose Bleed, Oral Ulcers, Ringing in the Ears, Sinus Pain, Sore Throat, Visual Disturbances and Yellow Eyes. Respiratory Not Present- Bloody sputum, Chronic Cough, Difficulty Breathing, Snoring and Wheezing. Breast Not Present- Breast Mass, Breast Pain, Nipple Discharge and Skin Changes. Cardiovascular Not Present- Chest Pain, Difficulty Breathing Lying Down, Leg Cramps, Palpitations, Rapid Heart Rate, Shortness of Breath and Swelling of Extremities. Gastrointestinal Present- Hemorrhoids and Indigestion. Not Present- Abdominal Pain, Bloating, Bloody Stool, Change in Bowel Habits, Chronic diarrhea, Constipation, Difficulty Swallowing, Excessive gas, Gets full quickly at meals, Nausea, Rectal Pain and Vomiting. Female Genitourinary Not Present- Frequency, Nocturia, Painful Urination, Pelvic Pain and Urgency. Musculoskeletal Present- Joint Pain. Not Present- Back Pain, Joint Stiffness, Muscle Pain, Muscle Weakness and Swelling of Extremities. Neurological Not Present- Decreased Memory, Fainting, Headaches, Numbness, Seizures, Tingling, Tremor, Trouble walking and Weakness. Psychiatric Not Present- Anxiety, Bipolar, Change in Sleep Pattern, Depression, Fearful and Frequent crying. Endocrine Not Present- Cold Intolerance, Excessive Hunger, Hair Changes, Heat Intolerance, Hot flashes and New Diabetes. Hematology Present- Easy Bruising. Not Present- Blood Thinners, Excessive bleeding, Gland problems, HIV and Persistent Infections.  Vitals Claiborne Billings Dockery LPN; 17/09/812 4:81 PM) 04/14/2020 4:01 PM Weight: 134.4 lb Height: 64in Body Surface Area: 1.65 m Body Mass Index: 23.07 kg/m  Temp.: 98.47F (Oral)  Pulse: 114 (Regular)  BP: 122/74(Sitting, Left Arm, Standard)       Physical Exam Harrell Gave M. Ilene Witcher MD; 04/14/2020 4:42 PM) The physical exam findings are as follows: Note:  Constitutional: No acute distress; conversant; no deformities; wearing mask Eyes: Moist conjunctiva; no lid lag; anicteric sclerae; pupils equal and round Lungs: Normal respiratory effort CV: rrr; no palpable thrill; no pitting edema GI: Abdomen soft, nontender, nondistended; no palpable hepatosplenomegaly Anorectal: External skin tags/hemorrhoids - 3 column; DRE - deferred due to discomfort. On gentle effacement - evidence of posterior midline anal fissure with some visible muscle fibers. No clearly defined mass or fistula. MSK: Normal gait Psychiatric: Appropriate affect; alert and oriented 3 **A chaperone, [name] CMA, was present for the entire physical exam    Assessment & Plan Harrell Gave M. 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 The pathophysiology of anal fissures was discussed at length as well. We discussed with her persistent pain, proceeding with anorectal exam under anesthesia - if abnormal tissue present, obtaining biopsy; if it appears all attributable to chronic anal fissures, proceeding with injection of Botox into internal anal sphincter (chemical sphincterotomy). We discussed alternatives which thus far have not proven to be effective. -She denies any prior botox/dysport injections -The planned procedures, material risks (including, but not limited to, pain, bleeding, infection, scarring, need for blood transfusion, damage to anal sphincter, incontinence of gas and/or stool, recurrence of fissure and need for additional procedures, pneumonia, heart attack, stroke, death) benefits and alternatives to surgery were discussed at length. The patient's questions were  answered to her satisfaction, she voiced understanding and elected to proceed with surgery. Additionally, we discussed typical postoperative expectations and the recovery process.  This patient encounter took 24 minutes today to perform the following: take history, perform exam, review outside records, interpret imaging, counsel the patient on their  diagnosis and document encounter, findings & plan in the EHR  Signed by Ileana Roup, MD (04/14/2020 5:13 PM)

## 2020-04-17 NOTE — H&P (Signed)
CC: Long-term follow-up - recurrent anal fissure symptoms  HPI: Carol Scott is a very pleasant 69yoF with hx of HTN, GERD, connective tissue disorder (on hydroxychloroquine) whom had previously presented for evaluation of possible hemorrhoids. She reported a multiyear history of issues with "hemorrhoids" and having to splint with bowel movements. She was diagnosed with hemorrhoids in 2003 and underwent hemorrhoidectomy. She had recurrent issues with anal pain following all this and subsequently underwent an exam under anesthesia and was found to have a fissure. The physician that time also diagnosed her with some degree of uterine prolapse and she underwent a laparoscopic TAH/BSO for this. She had a "hemorrhoids" flare following her colonoscopy prep with Dr. Henrene Pastor which was performed 01/04/18 and notable for a small diminutive polyp at the hepatic flexure which came back as SSP as well as internal hemorrhoids. Following all this her symptoms slowly improved.  She has a history of 3 vaginal deliveries the first of which required an episiotomy. She describes some issues with liquid stool incontinence if she takes too much MiraLAX. She denies any incontinence to gas, liquid or solid stool - previously had been documented about possible liquid issues but she is adamant this isn't the case.  She does not report needing to use a finger in the vagina to have a bowel movement nor finger in the rectum to remove stool. She reports having some tissue on the outside of her anus as well   INTERVAL HX She had partially completed therapy with Earlie Counts and noted improvement in all of her pelvic complaints. She had tried topical applications of ointments including nifedipine gel for her anal fissure. She had resolution of her pains but has dealt with issues with recurrence intermittently since last being seen in office 2 years ago. We recommended she return for follow-up in approximately 3 months after  rinsing her but she was lost to follow-up and never returned. She returns today with ongoing issues with intermittent visual leg symptoms where she will have sharp knifelike pain with bowel movements a lot for couple of weeks and improved. She denies any issues with rectal prolapse and states that whenever his out is typically always on the outside and looks like a grape.  PMH: HTN (well controlled on oral antihypertensive), GERD (well controlled on PPI), connective tissue disorder (well controlled on hydroxychloroquine)  PSH: Lap TAH/BSO for prolapse; hemorrhoidectomy in 2003; anal examination anesthesia following this and diagnosed with fissure. Denies any other abdominal or anorectal operations  FHx: Denies FHx of malignancy  Social: Denies use of tobacco/drugs; social EtOH use; retired.  ROS: A comprehensive 10 system review of systems was completed with the patient and pertinent findings as noted above.  The patient is a 69 year old female.   Allergies [name], LPN; 41/11/6061 0:16 PM) Dyes  No Known Drug Allergies  [03/30/2018]: Allergies Reconciled   Medication History [name], LPN; 01/0/9323 5:57 PM) Escitalopram Oxalate (10MG  Tablet, Oral) Active. Azelastine HCl (0.1% Solution, Nasal) Active. Fluconazole (150MG  Tablet, Oral) Active. HydroCHLOROthiazide (25MG  Tablet, Oral) Active. Hydroxychloroquine Sulfate (200MG  Tablet, Oral) Active. Losartan Potassium (100MG  Tablet, Oral) Active. Montelukast Sodium (10MG  Tablet, Oral) Active. Pantoprazole Sodium (40MG  Tablet DR, Oral) Active. Vitamin D3 (50000UNIT Tablet, Oral) Active. Medications Reconciled    Review of Systems Harrell Gave M. Navayah Sok MD; 04/14/2020 4:41 PM) General Not Present- Appetite Loss, Chills, Fatigue, Fever, Night Sweats, Weight Gain and Weight Loss. Skin Not Present- Change in Wart/Mole, Dryness, Hives, Jaundice, New Lesions, Non-Healing Wounds, Rash and Ulcer. HEENT  Present-  Seasonal Allergies and Wears glasses/contact lenses. Not Present- Earache, Hearing Loss, Hoarseness, Nose Bleed, Oral Ulcers, Ringing in the Ears, Sinus Pain, Sore Throat, Visual Disturbances and Yellow Eyes. Respiratory Not Present- Bloody sputum, Chronic Cough, Difficulty Breathing, Snoring and Wheezing. Breast Not Present- Breast Mass, Breast Pain, Nipple Discharge and Skin Changes. Cardiovascular Not Present- Chest Pain, Difficulty Breathing Lying Down, Leg Cramps, Palpitations, Rapid Heart Rate, Shortness of Breath and Swelling of Extremities. Gastrointestinal Present- Hemorrhoids and Indigestion. Not Present- Abdominal Pain, Bloating, Bloody Stool, Change in Bowel Habits, Chronic diarrhea, Constipation, Difficulty Swallowing, Excessive gas, Gets full quickly at meals, Nausea, Rectal Pain and Vomiting. Female Genitourinary Not Present- Frequency, Nocturia, Painful Urination, Pelvic Pain and Urgency. Musculoskeletal Present- Joint Pain. Not Present- Back Pain, Joint Stiffness, Muscle Pain, Muscle Weakness and Swelling of Extremities. Neurological Not Present- Decreased Memory, Fainting, Headaches, Numbness, Seizures, Tingling, Tremor, Trouble walking and Weakness. Psychiatric Not Present- Anxiety, Bipolar, Change in Sleep Pattern, Depression, Fearful and Frequent crying. Endocrine Not Present- Cold Intolerance, Excessive Hunger, Hair Changes, Heat Intolerance, Hot flashes and New Diabetes. Hematology Present- Easy Bruising. Not Present- Blood Thinners, Excessive bleeding, Gland problems, HIV and Persistent Infections.  Vitals Claiborne Billings Dockery LPN; 16/11/628 1:60 PM) 04/14/2020 4:01 PM Weight: 134.4 lb Height: 64in Body Surface Area: 1.65 m Body Mass Index: 23.07 kg/m  Temp.: 98.52F (Oral)  Pulse: 114 (Regular)  BP: 122/74(Sitting, Left Arm, Standard)       Physical Exam Harrell Gave M. Suhana Wilner MD; 04/14/2020 4:42 PM) The physical exam findings are as follows: Note:  Constitutional: No acute distress; conversant; no deformities; wearing mask Eyes: Moist conjunctiva; no lid lag; anicteric sclerae; pupils equal and round Lungs: Normal respiratory effort CV: rrr; no palpable thrill; no pitting edema GI: Abdomen soft, nontender, nondistended; no palpable hepatosplenomegaly Anorectal: External skin tags/hemorrhoids - 3 column; DRE - deferred due to discomfort. On gentle effacement - evidence of posterior midline anal fissure with some visible muscle fibers. No clearly defined mass or fistula. MSK: Normal gait Psychiatric: Appropriate affect; alert and oriented 3 **A chaperone, [name] CMA, was present for the entire physical exam    Assessment & Plan Harrell Gave M. 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 Reas is a very pleasant 9yoF with hx of HTN, GERD, connective tissue d/o here today after being lost to follow-up for ongoing anal pain in setting of potentially chronic anal fissure Impression: -The anatomy and physiology of the anal canal was discussed at length with the patient. The pathophysiology of anal fissures was discussed at length as well. We discussed with her persistent pain, proceeding with anorectal exam under anesthesia - if abnormal tissue present, obtaining biopsy; if it appears all attributable to chronic anal fissures, proceeding with injection of Botox into internal anal sphincter (chemical sphincterotomy). We discussed alternatives which thus far have not proven to be effective. -She denies any prior botox/dysport injections -The planned procedures, material risks (including, but not limited to, pain, bleeding, infection, scarring, need for blood transfusion, damage to anal sphincter, incontinence of gas and/or stool, recurrence of fissure and need for additional procedures, pneumonia, heart attack, stroke, death) benefits and alternatives to surgery were discussed at length. The patient's questions were  answered to her satisfaction, she voiced understanding and elected to proceed with surgery. Additionally, we discussed typical postoperative expectations and the recovery process.  This patient encounter took 24 minutes today to perform the following: take history, perform exam, review outside records, interpret imaging, counsel the patient on their  diagnosis and document encounter, findings & plan in the EHR  Signed by Ileana Roup, MD (04/14/2020 5:13 PM)

## 2020-04-21 ENCOUNTER — Other Ambulatory Visit (HOSPITAL_COMMUNITY)
Admission: RE | Admit: 2020-04-21 | Discharge: 2020-04-21 | Disposition: A | Discharge status: Home / Self Care | Payer: Medicare Other | Source: Ambulatory Visit | Attending: Surgery | Admitting: Surgery

## 2020-04-21 DIAGNOSIS — Z01818 Encounter for other preprocedural examination: Secondary | ICD-10-CM | POA: Insufficient documentation

## 2020-04-21 DIAGNOSIS — Z20822 Contact with and (suspected) exposure to covid-19: Secondary | ICD-10-CM | POA: Diagnosis not present

## 2020-04-21 LAB — SARS CORONAVIRUS 2 (TAT 6-24 HRS): SARS Coronavirus 2: NEGATIVE

## 2020-04-22 ENCOUNTER — Encounter (HOSPITAL_BASED_OUTPATIENT_CLINIC_OR_DEPARTMENT_OTHER): Payer: Self-pay | Admitting: Surgery

## 2020-04-22 ENCOUNTER — Other Ambulatory Visit: Payer: Self-pay

## 2020-04-22 NOTE — Progress Notes (Signed)
Spoke w/ via phone for pre-op interview--- PT Lab needs dos----  CBCdiff, CMP             Lab results------ current 12 lead tracing dated 01-08-2020 requested and received via fax, placed in chart COVID test ------ done 04-21-2020 result in epic Arrive at ------- 0815 NPO after MN NO Solid Food.  Clear liquids from MN until--- 0715 Medications to take morning of surgery -----  Protonix Diabetic medication -----  n/a Patient Special Instructions ----- per pt was given bowel prep from dr white office, told to follow those instructions Pre-Op special Istructions ----- n/a Patient verbalized understanding of instructions that were given at this phone interview. Patient denies shortness of breath, chest pain, fever, cough at this phone interview.   Anesthesia :  HTN,  Connective tissue disease.  Pt had evaluation with cardiology for palpitations, r/o afib , sob, and possible angina at rest.  Per pt all symptoms resolved.  Refer to Dr Doree Barthel note in epic 04-07-2020.  PCP:  Dr Shelbie Ammons Pomposini Cardiologist :  Dr [facility]born [facility] 04-07-2020) Chest x-ray :  > 5 in care everywhere EKG : 01-08-2020 (12 lead tracing with chart) Echo : 03-28-2020  [facility] Stress test:  03-04-2020  [facility] Cardiac Cath :   no Activity level:   Pt denies any sob with any activity Sleep Study/ CPAP :  NO Fasting Blood Sugar :      / Checks Blood Sugar -- times a day:   N/A Blood Thinner/ Instructions /Last Dose:  NO ASA / Instructions/ Last Dose :  NO

## 2020-04-24 ENCOUNTER — Ambulatory Visit (HOSPITAL_BASED_OUTPATIENT_CLINIC_OR_DEPARTMENT_OTHER)
Admission: RE | Admit: 2020-04-24 | Discharge: 2020-04-24 | Disposition: A | Discharge status: Home / Self Care | Payer: Medicare Other | Attending: Surgery | Admitting: Surgery

## 2020-04-24 ENCOUNTER — Encounter (HOSPITAL_BASED_OUTPATIENT_CLINIC_OR_DEPARTMENT_OTHER)
Admission: RE | Disposition: A | Discharge status: Home / Self Care | Payer: Self-pay | Source: Home / Self Care | Attending: Surgery

## 2020-04-24 ENCOUNTER — Other Ambulatory Visit: Payer: Self-pay

## 2020-04-24 ENCOUNTER — Ambulatory Visit (HOSPITAL_BASED_OUTPATIENT_CLINIC_OR_DEPARTMENT_OTHER): Payer: Medicare Other | Admitting: Anesthesiology

## 2020-04-24 ENCOUNTER — Encounter (HOSPITAL_BASED_OUTPATIENT_CLINIC_OR_DEPARTMENT_OTHER): Payer: Self-pay | Admitting: Surgery

## 2020-04-24 DIAGNOSIS — L949 Localized connective tissue disorder, unspecified: Secondary | ICD-10-CM | POA: Insufficient documentation

## 2020-04-24 DIAGNOSIS — K602 Anal fissure, unspecified: Secondary | ICD-10-CM | POA: Insufficient documentation

## 2020-04-24 DIAGNOSIS — Z79899 Other long term (current) drug therapy: Secondary | ICD-10-CM | POA: Diagnosis not present

## 2020-04-24 DIAGNOSIS — I10 Essential (primary) hypertension: Secondary | ICD-10-CM | POA: Insufficient documentation

## 2020-04-24 DIAGNOSIS — Z87891 Personal history of nicotine dependence: Secondary | ICD-10-CM | POA: Insufficient documentation

## 2020-04-24 DIAGNOSIS — K219 Gastro-esophageal reflux disease without esophagitis: Secondary | ICD-10-CM | POA: Insufficient documentation

## 2020-04-24 DIAGNOSIS — K6289 Other specified diseases of anus and rectum: Secondary | ICD-10-CM | POA: Insufficient documentation

## 2020-04-24 HISTORY — PX: RECTAL EXAM UNDER ANESTHESIA: SHX6399

## 2020-04-24 HISTORY — DX: Nontoxic multinodular goiter: E04.2

## 2020-04-24 HISTORY — DX: Presence of spectacles and contact lenses: Z97.3

## 2020-04-24 HISTORY — DX: Unspecified hemorrhoids: K64.9

## 2020-04-24 HISTORY — DX: Other specified diseases of anus and rectum: K62.89

## 2020-04-24 HISTORY — DX: Personal history of other specified conditions: Z87.898

## 2020-04-24 HISTORY — DX: Systemic involvement of connective tissue, unspecified: M35.9

## 2020-04-24 HISTORY — PX: BOTOX INJECTION: SHX5754

## 2020-04-24 HISTORY — DX: Other cervical disc degeneration, unspecified cervical region: M50.30

## 2020-04-24 HISTORY — DX: Personal history of traumatic brain injury: Z87.820

## 2020-04-24 HISTORY — DX: Unspecified osteoarthritis, unspecified site: M19.90

## 2020-04-24 HISTORY — DX: Spondylosis without myelopathy or radiculopathy, cervical region: M47.812

## 2020-04-24 LAB — CBC WITH DIFFERENTIAL/PLATELET
Abs Immature Granulocytes: 0.01 10*3/uL (ref 0.00–0.07)
Basophils Absolute: 0 10*3/uL (ref 0.0–0.1)
Basophils Relative: 1 %
Eosinophils Absolute: 0.2 10*3/uL (ref 0.0–0.5)
Eosinophils Relative: 3 %
HCT: 44.9 % (ref 36.0–46.0)
Hemoglobin: 14.5 g/dL (ref 12.0–15.0)
Immature Granulocytes: 0 %
Lymphocytes Relative: 22 %
Lymphs Abs: 1.1 10*3/uL (ref 0.7–4.0)
MCH: 29.6 pg (ref 26.0–34.0)
MCHC: 32.3 g/dL (ref 30.0–36.0)
MCV: 91.6 fL (ref 80.0–100.0)
Monocytes Absolute: 0.4 10*3/uL (ref 0.1–1.0)
Monocytes Relative: 9 %
Neutro Abs: 3.2 10*3/uL (ref 1.7–7.7)
Neutrophils Relative %: 65 %
Platelets: 239 10*3/uL (ref 150–400)
RBC: 4.9 MIL/uL (ref 3.87–5.11)
RDW: 13.3 % (ref 11.5–15.5)
WBC: 4.9 10*3/uL (ref 4.0–10.5)
nRBC: 0 % (ref 0.0–0.2)

## 2020-04-24 LAB — COMPREHENSIVE METABOLIC PANEL
ALT: 16 U/L (ref 0–44)
AST: 23 U/L (ref 15–41)
Albumin: 4.4 g/dL (ref 3.5–5.0)
Alkaline Phosphatase: 61 U/L (ref 38–126)
Anion gap: 10 (ref 5–15)
BUN: 14 mg/dL (ref 8–23)
CO2: 26 mmol/L (ref 22–32)
Calcium: 9.5 mg/dL (ref 8.9–10.3)
Chloride: 102 mmol/L (ref 98–111)
Creatinine, Ser: 0.92 mg/dL (ref 0.44–1.00)
GFR, Estimated: >60 mL/min (ref >60)
Glucose, Bld: 91 mg/dL (ref 70–99)
Potassium: 4.4 mmol/L (ref 3.5–5.1)
Sodium: 138 mmol/L (ref 135–145)
Total Bilirubin: 0.5 mg/dL (ref 0.3–1.2)
Total Protein: 8 g/dL (ref 6.5–8.1)

## 2020-04-24 SURGERY — EXAM UNDER ANESTHESIA, RECTUM
Anesthesia: General | Site: Rectum

## 2020-04-24 MED ORDER — OXYCODONE HCL 5 MG PO TABS
5.0000 mg | ORAL_TABLET | Freq: Once | ORAL | Status: AC | PRN
  Administered 2020-04-24: 5 mg via ORAL

## 2020-04-24 MED ORDER — OXYCODONE HCL 5 MG PO TABS
ORAL_TABLET | ORAL | Status: AC
  Filled 2020-04-24: qty 1

## 2020-04-24 MED ORDER — LACTATED RINGERS IV SOLN: INTRAVENOUS | Status: DC

## 2020-04-24 MED ORDER — BUPIVACAINE-EPINEPHRINE 0.25% -1:200000 IJ SOLN
INTRAMUSCULAR | Status: DC | PRN
  Administered 2020-04-24: 30 mL

## 2020-04-24 MED ORDER — ONABOTULINUMTOXINA 100 UNITS IJ SOLR
INTRAMUSCULAR | Status: DC | PRN
  Administered 2020-04-24: 90 [IU] via INTRAMUSCULAR

## 2020-04-24 MED ORDER — PROPOFOL 10 MG/ML IV BOLUS
INTRAVENOUS | Status: DC | PRN
  Administered 2020-04-24: 150 mg via INTRAVENOUS

## 2020-04-24 MED ORDER — TRAMADOL HCL 50 MG PO TABS
50.0000 mg | ORAL_TABLET | Freq: Four times a day (QID) | ORAL | 0 refills | Status: AC | PRN
Start: 2020-04-24 — End: 2020-04-29

## 2020-04-24 MED ORDER — ONDANSETRON 4 MG PO TBDP
4.0000 mg | ORAL_TABLET | Freq: Once | ORAL | Status: AC
  Administered 2020-04-24: 4 mg via ORAL

## 2020-04-24 MED ORDER — ONDANSETRON 4 MG PO TBDP
ORAL_TABLET | ORAL | Status: AC
  Filled 2020-04-24: qty 1

## 2020-04-24 MED ORDER — BUPIVACAINE LIPOSOME 1.3 % IJ SUSP
INTRAMUSCULAR | Status: DC | PRN
  Administered 2020-04-24: 20 mL

## 2020-04-24 MED ORDER — LIDOCAINE 2% (20 MG/ML) 5 ML SYRINGE
INTRAMUSCULAR | Status: DC | PRN
  Administered 2020-04-24: 100 mg via INTRAVENOUS

## 2020-04-24 MED ORDER — FENTANYL CITRATE (PF) 100 MCG/2ML IJ SOLN
INTRAMUSCULAR | Status: DC | PRN
  Administered 2020-04-24: 25 ug via INTRAVENOUS
  Administered 2020-04-24: 50 ug via INTRAVENOUS
  Administered 2020-04-24: 25 ug via INTRAVENOUS

## 2020-04-24 MED ORDER — PROPOFOL 10 MG/ML IV BOLUS
INTRAVENOUS | Status: AC
  Filled 2020-04-24: qty 20

## 2020-04-24 MED ORDER — ONDANSETRON HCL 4 MG/2ML IJ SOLN
INTRAMUSCULAR | Status: DC | PRN
  Administered 2020-04-24: 4 mg via INTRAVENOUS

## 2020-04-24 MED ORDER — ACETAMINOPHEN 500 MG PO TABS
ORAL_TABLET | ORAL | Status: AC
  Filled 2020-04-24: qty 2

## 2020-04-24 MED ORDER — ROCURONIUM BROMIDE 10 MG/ML (PF) SYRINGE
PREFILLED_SYRINGE | INTRAVENOUS | Status: AC
  Filled 2020-04-24: qty 10

## 2020-04-24 MED ORDER — ONDANSETRON HCL 4 MG/2ML IJ SOLN
INTRAMUSCULAR | Status: AC
  Filled 2020-04-24: qty 2

## 2020-04-24 MED ORDER — WHITE PETROLATUM EX OINT
TOPICAL_OINTMENT | CUTANEOUS | Status: AC
  Filled 2020-04-24: qty 5

## 2020-04-24 MED ORDER — ACETAMINOPHEN 500 MG PO TABS
1000.0000 mg | ORAL_TABLET | ORAL | Status: AC
  Administered 2020-04-24: 1000 mg via ORAL

## 2020-04-24 MED ORDER — CHLORHEXIDINE GLUCONATE CLOTH 2 % EX PADS: 6.0000 | MEDICATED_PAD | Freq: Once | CUTANEOUS | Status: DC

## 2020-04-24 MED ORDER — SODIUM CHLORIDE (PF) 0.9 % IJ SOLN
INTRAMUSCULAR | Status: DC | PRN
  Administered 2020-04-24: 2 mL

## 2020-04-24 MED ORDER — FENTANYL CITRATE (PF) 100 MCG/2ML IJ SOLN
INTRAMUSCULAR | Status: AC
  Filled 2020-04-24: qty 2

## 2020-04-24 MED ORDER — LIDOCAINE 2% (20 MG/ML) 5 ML SYRINGE
INTRAMUSCULAR | Status: AC
  Filled 2020-04-24: qty 5

## 2020-04-24 MED ORDER — OXYCODONE HCL 5 MG/5ML PO SOLN: 5.0000 mg | Freq: Once | ORAL | Status: AC | PRN

## 2020-04-24 MED ORDER — MIDAZOLAM HCL 2 MG/2ML IJ SOLN
INTRAMUSCULAR | Status: DC | PRN
  Administered 2020-04-24: 2 mg via INTRAVENOUS

## 2020-04-24 MED ORDER — MIDAZOLAM HCL 2 MG/2ML IJ SOLN
INTRAMUSCULAR | Status: AC
  Filled 2020-04-24: qty 2

## 2020-04-24 MED ORDER — BUPIVACAINE LIPOSOME 1.3 % IJ SUSP: 20.0000 mL | Freq: Once | INTRAMUSCULAR | Status: DC

## 2020-04-24 MED ORDER — FENTANYL CITRATE (PF) 100 MCG/2ML IJ SOLN
25.0000 ug | INTRAMUSCULAR | Status: DC | PRN
  Administered 2020-04-24: 25 ug via INTRAVENOUS

## 2020-04-24 SURGICAL SUPPLY — 67 items
APL SKNCLS STERI-STRIP NONHPOA (GAUZE/BANDAGES/DRESSINGS)
BENZOIN TINCTURE PRP APPL 2/3 (GAUZE/BANDAGES/DRESSINGS) IMPLANT
BLADE EXTENDED COATED 6.5IN (ELECTRODE) IMPLANT
BLADE HEX COATED 2.75 (ELECTRODE) ×4 IMPLANT
BLADE SURG 10 STRL SS (BLADE) IMPLANT
BLADE SURG 15 STRL LF DISP TIS (BLADE) ×2 IMPLANT
BLADE SURG 15 STRL SS (BLADE) ×4
BRIEF STRETCH FOR OB PAD LRG (UNDERPADS AND DIAPERS) ×4 IMPLANT
CANISTER SUCT 3000ML PPV (MISCELLANEOUS) ×4 IMPLANT
COVER BACK TABLE 60X90IN (DRAPES) ×4 IMPLANT
COVER MAYO STAND STRL (DRAPES) ×4 IMPLANT
COVER WAND RF STERILE (DRAPES) ×4 IMPLANT
DECANTER SPIKE VIAL GLASS SM (MISCELLANEOUS) IMPLANT
DRAPE HYSTEROSCOPY (MISCELLANEOUS) IMPLANT
DRAPE LAPAROTOMY 100X72 PEDS (DRAPES) ×4 IMPLANT
DRAPE SHEET LG 3/4 BI-LAMINATE (DRAPES) IMPLANT
DRAPE UTILITY XL STRL (DRAPES) IMPLANT
DRSG PAD ABDOMINAL 8X10 ST (GAUZE/BANDAGES/DRESSINGS) ×4 IMPLANT
GAUZE SPONGE 4X4 12PLY STRL (GAUZE/BANDAGES/DRESSINGS) ×4 IMPLANT
GAUZE SPONGE 4X4 12PLY STRL LF (GAUZE/BANDAGES/DRESSINGS) ×4 IMPLANT
GLOVE BIO SURGEON STRL SZ7.5 (GLOVE) ×4 IMPLANT
GLOVE INDICATOR 8.0 STRL GRN (GLOVE) ×4 IMPLANT
GOWN STRL REUS W/ TWL XL LVL3 (GOWN DISPOSABLE) ×2 IMPLANT
GOWN STRL REUS W/TWL XL LVL3 (GOWN DISPOSABLE) ×4
HYDROGEN PEROXIDE 16OZ (MISCELLANEOUS) IMPLANT
IV CATH 14GX2 1/4 (CATHETERS) IMPLANT
IV CATH 18G SAFETY (IV SOLUTION) IMPLANT
KIT SIGMOIDOSCOPE (SET/KITS/TRAYS/PACK) IMPLANT
KIT TURNOVER CYSTO (KITS) ×4 IMPLANT
LEGGING LITHOTOMY PAIR STRL (DRAPES) IMPLANT
LOOP VESSEL MAXI BLUE (MISCELLANEOUS) IMPLANT
MANIFOLD NEPTUNE II (INSTRUMENTS) ×4 IMPLANT
NDL SAFETY ECLIPSE 18X1.5 (NEEDLE) IMPLANT
NEEDLE HYPO 18GX1.5 SHARP (NEEDLE)
NEEDLE HYPO 22GX1.5 SAFETY (NEEDLE) ×4 IMPLANT
NEEDLE HYPO 25X1 1.5 SAFETY (NEEDLE) ×4 IMPLANT
NS IRRIG 500ML POUR BTL (IV SOLUTION) ×4 IMPLANT
PACK BASIN DAY SURGERY FS (CUSTOM PROCEDURE TRAY) ×4 IMPLANT
PENCIL SMOKE EVACUATOR (MISCELLANEOUS) ×4 IMPLANT
SPONGE HEMORRHOID 8X3CM (HEMOSTASIS) IMPLANT
SPONGE SURGIFOAM ABS GEL 12-7 (HEMOSTASIS) IMPLANT
SUCTION FRAZIER HANDLE 10FR (MISCELLANEOUS)
SUCTION TUBE FRAZIER 10FR DISP (MISCELLANEOUS) IMPLANT
SUT CHROMIC 2 0 SH (SUTURE) IMPLANT
SUT CHROMIC 3 0 SH 27 (SUTURE) IMPLANT
SUT MNCRL AB 4-0 PS2 18 (SUTURE) IMPLANT
SUT SILK 0 PSL NDL (SUTURE) IMPLANT
SUT SILK 0 TIES 10X30 (SUTURE) IMPLANT
SUT SILK 2 0 (SUTURE)
SUT SILK 2-0 18XBRD TIE 12 (SUTURE) IMPLANT
SUT VIC AB 2-0 SH 27 (SUTURE)
SUT VIC AB 2-0 SH 27XBRD (SUTURE) IMPLANT
SUT VIC AB 3-0 SH 18 (SUTURE) IMPLANT
SUT VIC AB 3-0 SH 27 (SUTURE) ×4
SUT VIC AB 3-0 SH 27X BRD (SUTURE) ×2 IMPLANT
SUT VIC AB 3-0 SH 27XBRD (SUTURE) IMPLANT
SUT VIC AB 4-0 P-3 18XBRD (SUTURE) IMPLANT
SUT VIC AB 4-0 P3 18 (SUTURE)
SYR 20ML LL LF (SYRINGE) IMPLANT
SYR BULB IRRIG 60ML STRL (SYRINGE) ×4 IMPLANT
SYR CONTROL 10ML LL (SYRINGE) ×4 IMPLANT
SYR TB 1ML LL NO SAFETY (SYRINGE) ×4 IMPLANT
TOWEL OR 17X26 10 PK STRL BLUE (TOWEL DISPOSABLE) ×4 IMPLANT
TRAY DSU PREP LF (CUSTOM PROCEDURE TRAY) ×4 IMPLANT
TUBE CONNECTING 12'X1/4 (SUCTIONS) ×1
TUBE CONNECTING 12X1/4 (SUCTIONS) ×3 IMPLANT
YANKAUER SUCT BULB TIP NO VENT (SUCTIONS) ×4 IMPLANT

## 2020-04-24 NOTE — Transfer of Care (Signed)
Immediate Anesthesia Transfer of Care Note  Patient: Jolin Benavides  Procedure(s) Performed: Procedure(s) (LRB): ANORECTAL EXAM UNDER ANESTHESIA, EXCISION OF PERIANAL LESION; BOTOX INJECTION (N/A) POSSIBLE BOTOX INJECTION (N/A)  Patient Location: PACU  Anesthesia Type: General  Level of Consciousness: awake, oriented, sedated and patient cooperative  Airway & Oxygen Therapy: Patient Spontanous Breathing and Patient connected to face mask oxygen  Post-op Assessment: Report given to PACU RN and Post -op Vital signs reviewed and stable  Post vital signs: Reviewed and stable  Complications: No apparent anesthesia complications Last Vitals:  Vitals Value Taken Time  BP 138/57 04/24/20 1045  Temp 36.4 C 04/24/20 1035  Pulse 74 04/24/20 1047  Resp 13 04/24/20 1047  SpO2 100 % 04/24/20 1047  Vitals shown include unvalidated device data.  Last Pain:  Vitals:   04/24/20 1035  PainSc: 0-No pain      Patients Stated Pain Goal: 5 (59/56/38 7564)  Complications: No complications documented.

## 2020-04-24 NOTE — Op Note (Signed)
04/24/2020  10:31 AM  PATIENT:  Carol Scott  69 y.o. female  Patient Care Team: Pomposini, Cherly Anderson, MD as PCP - General (Internal Medicine)  PRE-OPERATIVE DIAGNOSIS:  Recurrent anal fissure, possible anal mass  POST-OPERATIVE DIAGNOSIS:  Recurrent anal fissure; anal polypoid lesion right anterior  PROCEDURE:  1. Excision of anal polypoid lesion - right anterior 2. Chemical sphincterotomy (injection of botox, 90U) 3. Anorectal exam under anesthesia (unable to evaluate in office due to discomfort)  SURGEON:  Surgeon(s): Ileana Roup, MD    ANESTHESIA:   local and general  SPECIMEN:  Right anterior anal canal polypoid lesion  DISPOSITION OF SPECIMEN:  PATHOLOGY  COUNTS:  Sponge, needle, and instrument counts were reported correct x2 at conclusion.  EBL: 3 mL  PLAN OF CARE: Discharge to home after PACU  PATIENT DISPOSITION:  PACU - hemodynamically stable.  OR FINDINGS: Chronic scarring in the anal canal consistent with potential connective tissue disorder. Mild intrinsic stenosis but easily accomodates lubricated digit/small Hill Ferguson. Right anterior anal canal polypoid lesion - verrucous in appearance, excised and submitted for pathology. Posterior midline anal fissure.  DESCRIPTION: The patient was identified in the preoperative holding area and taken to the OR where she was placed on the operating room table. SCDs were placed.  General anesthesia was induced without difficulty. The patient was then positioned in high lithotomy with Allen stirrups. Pressure points were then evaluated and padded.  She was then prepped and draped in usual sterile fashion.  A surgical timeout was performed indicating the correct patient, procedure, and positioning.  A perianal block was performed using a dilute mixture of 0.25% Marcaine with epinephrine and Exparel.   After ascertaining that an appropriate level of anesthesia had been achieved, a well lubricated digital rectal exam  was performed. This demonstrated mild intrinsic anal canal stenosis more externally and internally.  She does have a history of connective tissue disorder and this may be secondarily related.  No palpable masses in the anal canal or distal rectum.  A small Hill-Ferguson anoscope was into the anal canal and circumferential inspection demonstrated on the external most aspect of the anal canal a polypoid-like lesion that looks verrucous in the right anterior position.  Posterior midline anal fissure.    Attention was turned to excision of this verrucous-like lesion.  Edges were marked with cautery and this was excised sharply working to tease this off the underlying sphincter muscle.  No sphincter muscle was divided during this portion of the procedure.  Hemostasis was achieved with electrocautery.  The specimen was passed off.  The cutaneous defect was then approximated using a running 3-0 Vicryl suture.  Given her chronic medically refractory anal fissure that was also evident in the posterior midline, decision was made to proceed with injection of Botox to complete a chemical sphincterotomy.  This was injected circumferentially in the intersphincteric groove, 90 units total.  This had been reconstituted in 2 cc of saline.  The anal canal remains widely patent to a small Hill-Ferguson or well lubricated digit.  Hemostasis was achieved.  Additional local anesthetic was infiltrated around the site of excision.  Sponge, needle, and instrument counts were reported correct x2.  She was taken out of lithotomy position and a dressing consisting of 4 x 4's, ABD, and mesh underwear was placed.  DISPOSITION: PACU in satisfactory condition.

## 2020-04-24 NOTE — Discharge Instructions (Addendum)
ANORECTAL SURGERY: POST OP INSTRUCTIONS  1. DIET: Follow a light bland diet the first 24 hours after arrival home, such as soup, liquids, crackers, etc.  Be sure to include lots of fluids daily.  Avoid fast food or heavy meals as your are more likely to get nauseated.  Eat a low fat diet the next few days after surgery.   2. Some bleeding with bowel movements is expected for the first couple of days but this should stop in between bowel movements  3. Take your usually prescribed home medications unless otherwise directed.  4. PAIN CONTROL: a. It is helpful to take an over-the-counter pain medication regularly for the first few days/weeks.  Choose from the following that works best for you: i. Ibuprofen (Advil, etc) Three 200mg  tabs every 6 hours as needed. ii. Acetaminophen (Tylenol, etc) 500-650mg  every 6 hours as needed iii. NOTE: You may take both of these medications together - most patients find it most helpful when alternating between the two (i.e. Ibuprofen at 6am, tylenol at 9am, ibuprofen at 12pm ...) b. A  prescription for pain medication may have been prescribed for you at discharge.  Take your pain medication as prescribed.  i. If you are having problems/concerns with the prescription medicine, please call us for further advice.  5. Avoid getting constipated.  Between the surgery and the pain medications, it is common to experience some constipation.  Increasing fluid intake (64oz of water per day) and taking a fiber supplement (such as Metamucil, Citrucel, FiberCon) 1-2 times a day regularly will usually help prevent this problem from occurring.  Take Miralax (over the counter) 1-2x/day while taking a narcotic pain medication. If no bowel movement after 48hours, you may additionally take a laxative like a bottle of Milk of Magnesia which can be purchased over the counter. Avoid enemas if possible as these are often painful.   6. Watch out for diarrhea.  If you have many loose bowel  movements, simplify your diet to bland foods.  Stop any stool softeners and decrease your fiber supplement. If this worsens or does not improve, please call us.  7. Wash / shower every day.  If you were discharged with a dressing, you may remove this the day after your surgery. You may shower normally, getting soap/water on your wound, particularly after bowel movements.  8. Soaking in a warm bath filled a couple inches ("Sitz bath") is a great way to clean the area after a bowel movement and many patients find it is a way to soothe the area.  9. ACTIVITIES as tolerated:   a. You may resume regular (light) daily activities beginning the next day--such as daily self-care, walking, climbing stairs--gradually increasing activities as tolerated.  If you can walk 30 minutes without difficulty, it is safe to try more intense activity such as jogging, treadmill, bicycling, low-impact aerobics, etc. b. Refrain from any heavy lifting or straining for the first 2 weeks after your procedure, particularly if your surgery was for hemorrhoids. c. Avoid activities that make your pain worse d. You may drive when you are no longer taking prescription pain medication, you can comfortably wear a seatbelt, and you can safely maneuver your car and apply brakes.  10. FOLLOW UP in our office a. Please call [facility] at [phone] to set up an appointment to see your surgeon in the office for a follow-up appointment approximately 2 weeks after your surgery. b. Make sure that you call for this appointment the day you arrive  home to insure a convenient appointment time.  9. If you have disability or family leave forms that need to be completed, you may have them completed by your primary care physician's office; for return to work instructions, please ask our office staff and they will be happy to assist you in obtaining this documentation   When to call us [phone]: 1. Poor pain control 2. Reactions / problems with  new medications (rash/itching, etc)  3. Fever over 101.5 F (38.5 C) 4. Inability to urinate 5. Nausea/vomiting 6. Worsening swelling or bruising 7. Continued bleeding from incision. 8. Increased pain, redness, or drainage from the incision  The clinic staff is available to answer your questions during regular business hours (8:30am-5pm).  Please don't hesitate to call and ask to speak to one of our nurses for clinical concerns.   A surgeon from [facility] is always on call at the hospitals   If you have a medical emergency, go to the nearest emergency room or call 911.   [facility], [place], [place], [place], [place]  [address] ? MAIN: [phone] FAX [phone] [email or web address]      Post Anesthesia Home Care Instructions  Activity: Get plenty of rest for the remainder of the day. A responsible individual must stay with you for 24 hours following the procedure.  For the next 24 hours, DO NOT: -Drive a car -Paediatric nurse -Drink alcoholic beverages -Take any medication unless instructed by your physician -Make any legal decisions or sign important papers.  Meals: Start with liquid foods such as gelatin or soup. Progress to regular foods as tolerated. Avoid greasy, spicy, heavy foods. If nausea and/or vomiting occur, drink only clear liquids until the nausea and/or vomiting subsides. Call your physician if vomiting continues.  Special Instructions/Symptoms: Your throat may feel dry or sore from the anesthesia or the breathing tube placed in your throat during surgery. If this causes discomfort, gargle with warm salt water. The discomfort should disappear within 24 hours.  If you had a scopolamine patch placed behind your ear for the management of post- operative nausea and/or vomiting:  1. The medication in the patch is effective for 72 hours, after which it should be removed.  Wrap patch in a tissue and  discard in the trash. Wash hands thoroughly with soap and water. 2. You may remove the patch earlier than 72 hours if you experience unpleasant side effects which may include dry mouth, dizziness or visual disturbances. 3. Avoid touching the patch. Wash your hands with soap and water after contact with the patch.       Information for Discharge Teaching: EXPAREL (bupivacaine liposome injectable suspension)   Your surgeon gave you EXPAREL(bupivacaine) in your surgical incision to help control your pain after surgery.   EXPAREL is a local anesthetic that provides pain relief by numbing the tissue around the surgical site.  EXPAREL is designed to release pain medication over time and can control pain for up to 72 hours.  Depending on how you respond to EXPAREL, you may require less pain medication during your recovery.  Possible side effects:  Temporary loss of sensation or ability to move in the area where bupivacaine was injected.  Nausea, vomiting, constipation  Rarely, numbness and tingling in your mouth or lips, lightheadedness, or anxiety may occur.  Call your doctor right away if you think you may be experiencing any of these sensations, or if you have other questions regarding possible side  effects.  Follow all other discharge instructions given to you by your surgeon or nurse. Eat a healthy diet and drink plenty of water or other fluids.  If you return to the hospital for any reason within 96 hours following the administration of EXPAREL, please inform your health care providers.

## 2020-04-24 NOTE — Anesthesia Postprocedure Evaluation (Signed)
Anesthesia Post Note  Patient: Carol Scott  Procedure(s) Performed: ANORECTAL EXAM UNDER ANESTHESIA, EXCISION OF PERIANAL LESION; BOTOX INJECTION (N/A Rectum) POSSIBLE BOTOX INJECTION (N/A )     Patient location during evaluation: PACU Anesthesia Type: General Level of consciousness: sedated and patient cooperative Pain management: pain level controlled Vital Signs Assessment: post-procedure vital signs reviewed and stable Respiratory status: spontaneous breathing Cardiovascular status: stable Anesthetic complications: no   No complications documented.  Last Vitals:  Vitals:   04/24/20 1130 04/24/20 1315  BP: (!) 147/71 (!) 132/55  Pulse: 76 66  Resp: 16 16  Temp: (!) 36.4 C (!) 35.6 C  SpO2: 100% 96%    Last Pain:  Vitals:   04/24/20 1315  PainSc: 0-No pain                 Nolon Nations

## 2020-04-24 NOTE — Anesthesia Preprocedure Evaluation (Addendum)
Anesthesia Evaluation  Patient identified by MRN, date of birth, ID band Patient awake    Reviewed: Allergy & Precautions, NPO status , Patient's Chart, lab work & pertinent test results  Airway Mallampati: II  TM Distance: >3 FB Neck ROM: Full    Dental  (+) Dental Advisory Given, Teeth Intact, Chipped,    Pulmonary neg pulmonary ROS, former smoker,    Pulmonary exam normal breath sounds clear to auscultation       Cardiovascular hypertension, Pt. on medications Normal cardiovascular exam Rhythm:Regular Rate:Normal     Neuro/Psych PSYCHIATRIC DISORDERS Anxiety negative neurological ROS     GI/Hepatic Neg liver ROS, GERD  ,  Endo/Other  negative endocrine ROS  Renal/GU negative Renal ROS     Musculoskeletal  (+) Arthritis ,   Abdominal   Peds  Hematology negative hematology ROS (+)   Anesthesia Other Findings   Reproductive/Obstetrics                           Anesthesia Physical Anesthesia Plan  ASA: II  Anesthesia Plan: General   Post-op Pain Management:    Induction: Intravenous  PONV Risk Score and Plan: 2 and Ondansetron, Treatment may vary due to age or medical condition and Dexamethasone  Airway Management Planned: LMA  Additional Equipment: None  Intra-op Plan:   Post-operative Plan: Extubation in OR  Informed Consent: I have reviewed the patients History and Physical, chart, labs and discussed the procedure including the risks, benefits and alternatives for the proposed anesthesia with the patient or authorized representative who has indicated his/her understanding and acceptance.     Dental advisory given  Plan Discussed with: CRNA  Anesthesia Plan Comments:       Anesthesia Quick Evaluation

## 2020-04-24 NOTE — Anesthesia Procedure Notes (Signed)
Procedure Name: LMA Insertion Date/Time: 04/24/2020 9:57 AM Performed by: Suan Halter, CRNA Pre-anesthesia Checklist: Patient identified, Emergency Drugs available, Suction available and Patient being monitored Patient Re-evaluated:Patient Re-evaluated prior to induction Oxygen Delivery Method: Circle system utilized Preoxygenation: Pre-oxygenation with 100% oxygen Induction Type: IV induction Ventilation: Mask ventilation without difficulty LMA: LMA inserted LMA Size: 4.0 Number of attempts: 1 Airway Equipment and Method: Bite block Placement Confirmation: positive ETCO2 Tube secured with: Tape Dental Injury: Teeth and Oropharynx as per pre-operative assessment

## 2020-04-24 NOTE — Interval H&P Note (Signed)
History and Physical Interval Note:  04/24/2020 9:38 AM  Carol Scott  has presented today for surgery, with the diagnosis of CHRONIC ANAL FISSURE, RULE OUT ANAL CANCER.  The various methods of treatment have been discussed with the patient and family. After consideration of risks, benefits and other options for treatment, the patient has consented to  Procedure(s): ANORECTAL EXAM UNDER ANESTHESIA, POSSILE BIOPSY OF ANAL LESION (N/A) POSSIBLE BOTOX INJECTION (N/A) as a surgical intervention.  The patient's history has been reviewed, patient examined, no change in status, stable for surgery.  I have reviewed the patient's chart and labs.  Questions were answered to the patient's satisfaction.    [name], MD [facility] Use [email or web address] to contact on call provider

## 2020-04-25 ENCOUNTER — Encounter (HOSPITAL_BASED_OUTPATIENT_CLINIC_OR_DEPARTMENT_OTHER): Payer: Self-pay | Admitting: Surgery

## 2020-04-25 LAB — SURGICAL PATHOLOGY

## 2020-06-20 ENCOUNTER — Other Ambulatory Visit: Payer: Self-pay | Admitting: Gastroenterology

## 2020-07-09 ENCOUNTER — Encounter: Payer: Self-pay | Admitting: Gastroenterology

## 2020-07-09 ENCOUNTER — Ambulatory Visit (INDEPENDENT_AMBULATORY_CARE_PROVIDER_SITE_OTHER): Payer: Medicare Other | Admitting: Gastroenterology

## 2020-07-09 ENCOUNTER — Other Ambulatory Visit (INDEPENDENT_AMBULATORY_CARE_PROVIDER_SITE_OTHER): Payer: Medicare Other

## 2020-07-09 VITALS — BP 138/64 | HR 72 | Ht 64.0 in | Wt 129.0 lb

## 2020-07-09 DIAGNOSIS — K838 Other specified diseases of biliary tract: Secondary | ICD-10-CM

## 2020-07-09 DIAGNOSIS — A09 Infectious gastroenteritis and colitis, unspecified: Secondary | ICD-10-CM

## 2020-07-09 LAB — HEPATIC FUNCTION PANEL
ALT: 14 U/L (ref 0–35)
AST: 17 U/L (ref 0–37)
Albumin: 4 g/dL (ref 3.5–5.2)
Alkaline Phosphatase: 62 U/L (ref 39–117)
Bilirubin, Direct: 0.1 mg/dL (ref 0.0–0.3)
Total Bilirubin: 0.4 mg/dL (ref 0.2–1.2)
Total Protein: 6.9 g/dL (ref 6.0–8.3)

## 2020-07-09 NOTE — Progress Notes (Signed)
07/09/2020 Carol Scott [id number] 08-Feb-1951   HISTORY OF PRESENT ILLNESS: This is a 70 year old female who is a patient of Dr. Blanch Media.  She had a colonoscopy in July 2019 that showed 1 sessile serrated polyp that was removed and internal hemorrhoids.  Repeat colonoscopy was recommended in 5-year interval.  She presents here today for follow-up of recent hospitalizations at [facility] in [place].  Historically she has been diagnosed with IBS and tends to struggle with constipation.  Earlier this month she developed sudden onset of explosive bloody diarrhea.  Was admitted to [facility] in [place], [place].  She had a CT scan of the abdomen and pelvis performed on January 4 that showed near pancolitis likely infectious or inflammatory in nature.  She then underwent an abdominal ultrasound that showed common bile duct dilatation to 10 mm.  Then the next day on January 5 she underwent a CTA of the abdomen and pelvis that suggested possible portal vein thrombosis and marked thickening of the entirety of the colon with sparing of the terminal sigmoid colon and rectosigmoid vault.  They noted prominent dilatation of the CBD measuring 13 mm without evidence of obstructing defect.  They also said that the gallbladder was enlarged.  Then on January 6 she underwent another abdominal ultrasound that showed the portal vein was patent and that showed some increased echogenicity of the liver with common bile duct measuring 8 mm.  They treated her with antibiotics for infectious colitis.  She did end up undergoing colonoscopy by Dr. Posey Pronto while she was there we will have to request those records.  Currently she is feeling much better.  Her stools are firming up, but still having a bowel movements about each time that she eats, which is more frequent than she is used to, but it is not diarrhea.  She is not waking up at night with bowel movements.  Sees very minimal amounts of bright red blood.  Was  having nausea and vomiting with all of this earlier this month, which has improved although still has some ongoing nausea as well as some upper abdominal discomfort.   Past Medical History:  Diagnosis Date  . Anal fissure   . Anxiety   . Cervical spondylosis without myelopathy   . 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 DDD (degenerative disc disease), cervical   . GERD (gastroesophageal reflux disease)   . Hemorrhoids   . History of concussion    04-22-2020 per pt hit head no loc 05/ 2021, residual balance issue and headaches (headaches resolved but still have balance issues)  . History of palpitations all test results in care everywhere  (04-22-2020  per pt all symptoms resolved)   per pt sent to cardiologist, dr Clayborn Bigness, 07/ 2021 (note in epic)  for palpatitions , poss. angia at rest, sob & r/o afib;  had 72 hour monitor (per note 04-07-2020 SR -- ST biphasic p waves no evidence afib/ flutter),  echo 03-28-2020 normal w/ ef >55%, and nuclear stress test 03-04-2020 borderline abnormal notable for poor uptake during rest , normal perfusion, and no evidence ischemia, ef 66%   . Hypertension    followed by pcp  . Multiple thyroid nodules    04-22-2020  per pt has had previous bx's which were benign , followed by pcp  . OA (osteoarthritis)    shoudlers, knees  . Perianal pain   . Wears glasses  Past Surgical History:  Procedure Laterality Date  . BOTOX INJECTION N/A 04/24/2020   Procedure: POSSIBLE BOTOX INJECTION;  Surgeon: Ileana Roup, MD;  Location: [facility];  Service: General;  Laterality: N/A;  . COLONOSCOPY WITH PROPOFOL  01-04-2018  dr Henrene Pastor  . [place]  2005  . RECTAL EXAM UNDER ANESTHESIA N/A 04/24/2020   Procedure: ANORECTAL EXAM UNDER ANESTHESIA, EXCISION OF PERIANAL LESION; BOTOX INJECTION;  Surgeon: Ileana Roup, MD;  Location: [facility];   Service: General;  Laterality: N/A;  . SUPRACERVICAL ABDOMINAL HYSTERECTOMY  2006   WITH BILATERAL SALPINGOOPHORECTOMY AND PELVIC FLOOR REPAIR (possible inculding sling)  . TONSILLECTOMY AND ADENOIDECTOMY  1960  . UPPER GASTROINTESTINAL ENDOSCOPY  01/06/2015    reports that she quit smoking about 26 years ago. Her smoking use included cigarettes. She quit after 30.00 years of use. She has never used smokeless tobacco. She reports current alcohol use. She reports that she does not use drugs. family history includes Breast cancer in her maternal aunt and mother; Diabetes in her father and mother; Heart attack in her brother; Heart disease in her father; Prostate cancer in her brother and father. Allergies  Allergen Reactions  . Contrast Media [Iodinated Diagnostic Agents] Other (See Comments)    sneezing  . Macrobid [Nitrofurantoin] Nausea Only    severe  . Metrizamide     Per does not remember reaction  . Prednisone     Per pt "red streaks on lowers legs"      Outpatient Encounter Medications as of 07/09/2020  Medication Sig  . BLACK ELDERBERRY PO Take by mouth daily.  . cetirizine (ZYRTEC) 10 MG tablet Take 10 mg by mouth daily.  . Cholecalciferol (VITAMIN D3) 125 MCG (5000 UT) CAPS Take by mouth daily.  . famotidine (PEPCID) 40 MG tablet Take 20 mg by mouth at bedtime.  . fluticasone (FLONASE) 50 MCG/ACT nasal spray Place into both nostrils daily as needed for allergies or rhinitis.  . hydroxychloroquine (PLAQUENIL) 200 MG tablet Take 400 mg by mouth daily.  Marland Kitchen losartan (COZAAR) 100 MG tablet Take 100 mg by mouth daily.   . Methylcellulose, Laxative, (CITRUCEL PO) Take by mouth daily.  . pantoprazole (PROTONIX) 40 MG tablet TAKE 1 TABLET BY MOUTH EVERY DAY (Patient taking differently: Take 40 mg by mouth daily.)  . [DISCONTINUED] escitalopram (LEXAPRO) 5 MG tablet Take 5 mg by mouth daily.   No facility-administered encounter medications on file as of 07/09/2020.     REVIEW OF  SYSTEMS  : All other systems reviewed and negative except where noted in the History of Present Illness.   PHYSICAL EXAM: BP 138/64   Pulse 72   Ht 5\' 4"  (1.626 m)   Wt 129 lb (58.5 kg)   BMI 22.14 kg/m  General: Well developed white female in no acute distress Head: Normocephalic and atraumatic Eyes:  Sclerae anicteric, conjunctiva pink. Ears: Normal auditory acuity Lungs: Clear throughout to auscultation; no W/R/R. Heart: Regular rate and rhythm; no M/R/G. Abdomen: Soft, non-distended.  BS present.  Minimal lower abdominal TTP. Musculoskeletal: Symmetrical with no gross deformities  Skin: No lesions on visible extremities Extremities: No edema  Neurological: Alert oriented x 4, grossly non-focal Psychological:  Alert and cooperative. Normal mood and affect  ASSESSMENT AND PLAN: *70 year old female with longstanding diagnosis of IBS and historically constipation who had sudden onset of bloody diarrhea and severe abdominal cramping presented to the hospital in [place], [place] earlier this month.  CT scan showed  pancolitis.  She had colonoscopy while she was there and we are trying to request those records as well as pathology results.  They were calling infectious versus inflammatory.  There was some question of possible portal vein thrombosis, but follow-up imaging refuted that.  She was treated with antibiotics.  At this point she is feeling much better.  Stools are firming up although still more frequent than what she is used to.  Minimal amounts of blood.  Still with some nausea, but she declined prescription for Zofran at this point as she says that it is manageable.  I think that it was likely an infectious colitis and is improving.  I recommended that she begin taking Florastor or Align probiotic twice daily and hopefully will continue to improve with time. *Dilated common bile duct on recent ultrasounds and CT scans measuring up to 13 mm: She is not status post cholecystectomy.   There is no mention that her LFTs are elevated.  We will check LFTs today and will plan for MRI abdomen/MRCP.  **Addendum: Received colonoscopy records from Dr. Posey Pronto from 06/20/2020.  Colonoscopy revealed moderate patchy erythema with petechia in the ascending colon compatible with colitis.  Mild to moderate erythema, friability, congestion, and petechiae in the transverse colon, descending colon, and sigmoid colon compatible with indeterminate colitis.  There was normal mucosa in the rectum.  Relatively unremarkable cecum and normal terminal ileum with green liquid stool scattered and no bleeding.  Pathology was very wordy and complicated:  Ascending colon biopsy showed mildly unevenly chronically inflamed fragments of colonic mucosa with focal fibrosis admixed with inflammation and lamina propria with no surface epithelium involvement with intraepithelial lymphocytes is seen.  Transverse colon biopsy showed uniform full mucosa thickness lymphoplasmacytic infiltrate admixed with fibrosis but no focal increase in intraepithelial lymphocytes or neutrophils was appreciated in the surface epithelium.  Descending colon biopsies showed increased fibrosis and a lymphoplasmacytic infiltrate with no acute cryptitis or crypt abscess identified.  No increase in intraepithelial lymphocytes or neutrophils were appreciated.  Sigmoid colon biopsies exhibited mildly uneven plasmacytic infiltrate present as well as a small lymphoid aggregate.  No acute cryptitis or crypt abscesses were identified.  Rectal biopsies showed mild fold mucosa thickness lymphoplasmacytic infiltrate with small lymphoid aggregate and the largest fragment.  No acute cryptitis or crypt abscesses were identified.  **As per phone notes, I discussed these results with Dr. Rush Landmark in Dr. Blanch Media absence and we are requesting the slides to be sent for our review.   CC:  Pomposini, Cherly Anderson, MD

## 2020-07-09 NOTE — Patient Instructions (Addendum)
If you are age 70 or older, your body mass index should be between 23-30. Your Body mass index is 22.14 kg/m. If this is out of the aforementioned range listed, please consider follow up with your Primary Care Provider.  If you are age 87 or younger, your body mass index should be between 19-25. Your Body mass index is 22.14 kg/m. If this is out of the aformentioned range listed, please consider follow up with your Primary Care Provider.   You have been scheduled for an MRI at [facility] Radiology First floor on 07/22/20 . Your appointment time is 8:00am. Please arrive 15 minutes prior to your appointment time for registration purposes. Please make certain not to have anything to eat or drink 6 hours prior to your test. In addition, if you have any metal in your body, have a pacemaker or defibrillator, please be sure to let your ordering physician know. This test typically takes 45 minutes to 1 hour to complete. Should you need to reschedule, please call [phone] to do so.  Start Probiotic such as Software engineer.  Your provider has requested that you go to the basement level for lab work before leaving today. Press "B" on the elevator. The lab is located at the first door on the left as you exit the elevator.  Due to recent changes in healthcare laws, you may see the results of your imaging and laboratory studies on MyChart before your provider has had a chance to review them.  We understand that in some cases there may be results that are confusing or concerning to you. Not all laboratory results come back in the same time frame and the provider may be waiting for multiple results in order to interpret others.  Please give [place] 48 hours in order for your provider to thoroughly review all the results before contacting the office for clarification of your results.   Thank you for choosing me and [facility].  Alonza Bogus, PA-C

## 2020-07-11 ENCOUNTER — Telehealth: Payer: Self-pay | Admitting: Gastroenterology

## 2020-07-11 ENCOUNTER — Encounter: Payer: Self-pay | Admitting: Gastroenterology

## 2020-07-11 DIAGNOSIS — R197 Diarrhea, unspecified: Secondary | ICD-10-CM

## 2020-07-11 DIAGNOSIS — A09 Infectious gastroenteritis and colitis, unspecified: Secondary | ICD-10-CM

## 2020-07-11 DIAGNOSIS — K838 Other specified diseases of biliary tract: Secondary | ICD-10-CM | POA: Insufficient documentation

## 2020-07-11 NOTE — Telephone Encounter (Signed)
Patient calling states she is having a lot of diarrhea still and is nervous about the weekend coming. Seeking advise

## 2020-07-11 NOTE — Telephone Encounter (Signed)
The pt was seen on 1/26 and states that since she was here she has developed very watery diarrhea.  About 6 watery stools today. She says she is not taking any thing for the diarrhea and finished antibiotics about 2 weeks ago for infectious colitis.  She has no fever, abd pain or bleeding.  Please advise

## 2020-07-11 NOTE — Progress Notes (Signed)
Noted  

## 2020-07-11 NOTE — Telephone Encounter (Signed)
Please let the patient know that I actually just received her records from her colonoscopy and biopsy results this afternoon.  Dr. Henrene Pastor is covering the hospital this week so I discussed with Dr. Rush Landmark instead as the pathology results were not exactly straightforward.  Looks like there may be some chronic changes on the pathology results as well, possibly indicating some other underlying condition on top of the acute symptoms that she has been having.  Dr. Rush Landmark said that we need to have the pathology slides reviewed at our hospital.  Chong Sicilian, will you please contact [facility] and speak with the pathology department.  We need to request the slides and paraffin block and have it sent to [facility] long pathology attention:  Dr. Mena Goes at [facility] [place]. [address], [place], [place], [address].  Dr. Rush Landmark said that if you have any questions regarding transferring this the number for pathology is [phone], ask for [facility].  In regards to the patient's symptoms he said that she needs to have repeat stool studies performed so please order a C. difficile PCR and a stool GI pathogen panel.  Also lets have her get labs including a CRP and sed rate.  I know that it is Friday so it may be difficult for her to complete this today, but as soon as possible.  In the interim she can use Imodium to try to manage through the weekend.  Bland diet, a lot of fluids, remain hydrated.  Thank you,  Jess

## 2020-07-11 NOTE — Telephone Encounter (Signed)
Left message on machine to call back  

## 2020-07-14 ENCOUNTER — Emergency Department (HOSPITAL_COMMUNITY): Payer: Medicare Other

## 2020-07-14 ENCOUNTER — Encounter (HOSPITAL_COMMUNITY): Payer: Self-pay

## 2020-07-14 ENCOUNTER — Emergency Department (HOSPITAL_COMMUNITY)
Admission: EM | Admit: 2020-07-14 | Discharge: 2020-07-14 | Disposition: A | Discharge status: Home / Self Care | Payer: Medicare Other | Attending: Emergency Medicine | Admitting: Emergency Medicine

## 2020-07-14 ENCOUNTER — Other Ambulatory Visit: Payer: Self-pay

## 2020-07-14 DIAGNOSIS — K6289 Other specified diseases of anus and rectum: Secondary | ICD-10-CM | POA: Diagnosis not present

## 2020-07-14 DIAGNOSIS — Z87891 Personal history of nicotine dependence: Secondary | ICD-10-CM | POA: Insufficient documentation

## 2020-07-14 DIAGNOSIS — Z79899 Other long term (current) drug therapy: Secondary | ICD-10-CM | POA: Insufficient documentation

## 2020-07-14 DIAGNOSIS — I10 Essential (primary) hypertension: Secondary | ICD-10-CM | POA: Diagnosis not present

## 2020-07-14 DIAGNOSIS — R197 Diarrhea, unspecified: Secondary | ICD-10-CM | POA: Insufficient documentation

## 2020-07-14 LAB — URINALYSIS, ROUTINE W REFLEX MICROSCOPIC
Bilirubin Urine: NEGATIVE
Glucose, UA: NEGATIVE mg/dL
Hgb urine dipstick: NEGATIVE
Ketones, ur: NEGATIVE mg/dL
Leukocytes,Ua: NEGATIVE
Nitrite: NEGATIVE
Protein, ur: NEGATIVE mg/dL
Specific Gravity, Urine: 1.009 (ref 1.005–1.030)
pH: 6 (ref 5.0–8.0)

## 2020-07-14 LAB — COMPREHENSIVE METABOLIC PANEL
ALT: 20 U/L (ref 0–44)
AST: 21 U/L (ref 15–41)
Albumin: 4 g/dL (ref 3.5–5.0)
Alkaline Phosphatase: 61 U/L (ref 38–126)
Anion gap: 12 (ref 5–15)
BUN: 16 mg/dL (ref 8–23)
CO2: 25 mmol/L (ref 22–32)
Calcium: 9.4 mg/dL (ref 8.9–10.3)
Chloride: 103 mmol/L (ref 98–111)
Creatinine, Ser: 1.14 mg/dL — ABNORMAL HIGH (ref 0.44–1.00)
GFR, Estimated: 52 mL/min — ABNORMAL LOW (ref >60)
Glucose, Bld: 101 mg/dL — ABNORMAL HIGH (ref 70–99)
Potassium: 4.3 mmol/L (ref 3.5–5.1)
Sodium: 140 mmol/L (ref 135–145)
Total Bilirubin: 0.6 mg/dL (ref 0.3–1.2)
Total Protein: 7.1 g/dL (ref 6.5–8.1)

## 2020-07-14 LAB — CBC
HCT: 40.9 % (ref 36.0–46.0)
Hemoglobin: 13.1 g/dL (ref 12.0–15.0)
MCH: 29.7 pg (ref 26.0–34.0)
MCHC: 32 g/dL (ref 30.0–36.0)
MCV: 92.7 fL (ref 80.0–100.0)
Platelets: 224 10*3/uL (ref 150–400)
RBC: 4.41 MIL/uL (ref 3.87–5.11)
RDW: 13.9 % (ref 11.5–15.5)
WBC: 10.6 10*3/uL — ABNORMAL HIGH (ref 4.0–10.5)
nRBC: 0 % (ref 0.0–0.2)

## 2020-07-14 LAB — LIPASE, BLOOD: Lipase: 25 U/L (ref 11–51)

## 2020-07-14 MED ORDER — METRONIDAZOLE 500 MG PO TABS
500.0000 mg | ORAL_TABLET | Freq: Three times a day (TID) | ORAL | 0 refills | Status: DC
Start: 2020-07-14 — End: 2020-08-18

## 2020-07-14 MED ORDER — CIPROFLOXACIN HCL 500 MG PO TABS: 500.0000 mg | ORAL_TABLET | Freq: Two times a day (BID) | ORAL | 0 refills | Status: DC

## 2020-07-14 MED ORDER — SODIUM CHLORIDE 0.9 % IV BOLUS (SEPSIS)
1000.0000 mL | Freq: Once | INTRAVENOUS | Status: AC
  Administered 2020-07-14: 1000 mL via INTRAVENOUS

## 2020-07-14 MED ORDER — MORPHINE SULFATE (PF) 4 MG/ML IV SOLN
4.0000 mg | Freq: Once | INTRAVENOUS | Status: AC
  Administered 2020-07-14: 4 mg via INTRAVENOUS
  Filled 2020-07-14: qty 1

## 2020-07-14 MED ORDER — SODIUM CHLORIDE 0.9 % IV SOLN: 1000.0000 mL | INTRAVENOUS | Status: DC

## 2020-07-14 MED ORDER — ONDANSETRON HCL 4 MG/2ML IJ SOLN
4.0000 mg | Freq: Once | INTRAMUSCULAR | Status: AC
  Administered 2020-07-14: 4 mg via INTRAVENOUS
  Filled 2020-07-14: qty 2

## 2020-07-14 MED ORDER — METRONIDAZOLE 500 MG PO TABS: 500.0000 mg | ORAL_TABLET | Freq: Three times a day (TID) | ORAL | 0 refills | Status: DC

## 2020-07-14 NOTE — ED Triage Notes (Signed)
Pt presents with c/o diarrhea for approx one month off and on. Pt reports she was admitted earlier this month for rectal bleeding and diarrhea. Pt reports she does have lots of rectal irritation and hemorrhoids now which is causing some blood to show up in her stool but she is not having the significant bleeding that she was having before.

## 2020-07-14 NOTE — ED Provider Notes (Signed)
[facility] DEPT Provider Note   CSN: [id number] Arrival date & time: 07/14/20  1139     History Chief Complaint  Patient presents with  . Diarrhea    Carol Scott is a 70 y.o. female.  HPI   Pt states Jan 4th she had rectal bleeding for 4 days.  SHe was admitted to a hospital in [place].  Pt had been treated for pancolitis.  Pt states she started having trouble with abd pain and diarrhea this past week but it was mild.  SHe did notice small amount of blood when wiping. She started to feel worse over the weekend.  Friday she felt very sick.  She is going to the bathroom frequently.  She has had 5 episodes of loose stool today.   Past Medical History:  Diagnosis Date  . Anal fissure   . Anxiety   . Cervical spondylosis without myelopathy   . 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 DDD (degenerative disc disease), cervical   . GERD (gastroesophageal reflux disease)   . Hemorrhoids   . History of concussion    04-22-2020 per pt hit head no loc 05/ 2021, residual balance issue and headaches (headaches resolved but still have balance issues)  . History of palpitations all test results in care everywhere  (04-22-2020  per pt all symptoms resolved)   per pt sent to cardiologist, dr Clayborn Bigness, 07/ 2021 (note in epic)  for palpatitions , poss. angia at rest, sob & r/o afib;  had 72 hour monitor (per note 04-07-2020 SR -- ST biphasic p waves no evidence afib/ flutter),  echo 03-28-2020 normal w/ ef >55%, and nuclear stress test 03-04-2020 borderline abnormal notable for poor uptake during rest , normal perfusion, and no evidence ischemia, ef 66%   . Hypertension    followed by pcp  . Multiple thyroid nodules    04-22-2020  per pt has had previous bx's which were benign , followed by pcp  . OA (osteoarthritis)    shoudlers, knees  . Perianal pain   . Wears glasses     Patient Active  Problem List   Diagnosis Date Noted  . Diarrhea 07/14/2020  . Infectious colitis 07/11/2020  . Common bile duct dilation 07/11/2020  . Nausea without vomiting 01/01/2015  . Abdominal pain, epigastric 01/01/2015    Past Surgical History:  Procedure Laterality Date  . BOTOX INJECTION N/A 04/24/2020   Procedure: POSSIBLE BOTOX INJECTION;  Surgeon: Ileana Roup, MD;  Location: [facility];  Service: General;  Laterality: N/A;  . COLONOSCOPY WITH PROPOFOL  01-04-2018  dr Henrene Pastor  . [place]  2005  . RECTAL EXAM UNDER ANESTHESIA N/A 04/24/2020   Procedure: ANORECTAL EXAM UNDER ANESTHESIA, EXCISION OF PERIANAL LESION; BOTOX INJECTION;  Surgeon: Ileana Roup, MD;  Location: [place];  Service: General;  Laterality: N/A;  . SUPRACERVICAL ABDOMINAL HYSTERECTOMY  2006   WITH BILATERAL SALPINGOOPHORECTOMY AND PELVIC FLOOR REPAIR (possible inculding sling)  . TONSILLECTOMY AND ADENOIDECTOMY  1960  . UPPER GASTROINTESTINAL ENDOSCOPY  01/06/2015     OB History   No obstetric history on file.     Family History  Problem Relation Age of Onset  . Breast cancer Mother   . Diabetes Mother   . Prostate cancer Father   . Diabetes Father   . Heart disease Father   . Prostate cancer Brother   . Breast cancer  Maternal Aunt   . Heart attack Brother   . Colon cancer Neg Hx   . Esophageal cancer Neg Hx   . Rectal cancer Neg Hx   . Stomach cancer Neg Hx     Social History   Tobacco Use  . Smoking status: Former Smoker    Years: 30.00    Types: Cigarettes    Quit date: 04/22/1994    Years since quitting: 26.2  . Smokeless tobacco: Never Used  Vaping Use  . Vaping Use: Never used  Substance Use Topics  . Alcohol use: Yes    Comment: socially  . Drug use: Never    Home Medications Prior to Admission medications   Medication Sig Start Date End Date Taking? Authorizing Provider  ciprofloxacin (CIPRO) 500 MG tablet Take 1 tablet  (500 mg total) by mouth 2 (two) times daily. 07/14/20  Yes Dorie Rank, MD  metroNIDAZOLE (FLAGYL) 500 MG tablet Take 1 tablet (500 mg total) by mouth 3 (three) times daily. 07/14/20  Yes Dorie Rank, MD  BLACK ELDERBERRY PO Take by mouth daily.    [provider]  cetirizine (ZYRTEC) 10 MG tablet Take 10 mg by mouth daily.    [provider]  Cholecalciferol (VITAMIN D3) 125 MCG (5000 UT) CAPS Take by mouth daily.    [provider]  famotidine (PEPCID) 40 MG tablet Take 20 mg by mouth at bedtime.    [provider]  fluticasone (FLONASE) 50 MCG/ACT nasal spray Place into both nostrils daily as needed for allergies or rhinitis.    [provider]  hydroxychloroquine (PLAQUENIL) 200 MG tablet Take 400 mg by mouth daily.    [provider]  losartan (COZAAR) 100 MG tablet Take 100 mg by mouth daily.     [provider]  Methylcellulose, Laxative, (CITRUCEL PO) Take by mouth daily.    [provider]  pantoprazole (PROTONIX) 40 MG tablet TAKE 1 TABLET BY MOUTH EVERY DAY Patient taking differently: Take 40 mg by mouth daily. 12/05/17   Irene Shipper, MD    Allergies    Contrast media [iodinated diagnostic agents], Macrobid [nitrofurantoin], Metrizamide, and Prednisone  Review of Systems   Review of Systems  All other systems reviewed and are negative.   Physical Exam Updated Vital Signs BP (!) 130/59   Pulse 88   Temp 98.2 F (36.8 C) (Oral)   Resp (!) 21   SpO2 97%   Physical Exam Vitals and nursing note reviewed.  Constitutional:      General: She is not in acute distress.    Appearance: She is well-developed and well-nourished.  HENT:     Head: Normocephalic and atraumatic.     Right Ear: External ear normal.     Left Ear: External ear normal.  Eyes:     General: No scleral icterus.       Right eye: No discharge.        Left eye: No discharge.     Conjunctiva/sclera: Conjunctivae normal.  Neck:      Trachea: No tracheal deviation.  Cardiovascular:     Rate and Rhythm: Normal rate and regular rhythm.     Pulses: Intact distal pulses.  Pulmonary:     Effort: Pulmonary effort is normal. No respiratory distress.     Breath sounds: Normal breath sounds. No stridor. No wheezing or rales.  Abdominal:     General: Bowel sounds are normal. There is no distension.     Palpations: Abdomen is  soft.     Tenderness: There is no abdominal tenderness. There is no guarding or rebound.  Musculoskeletal:        General: No tenderness or edema.     Cervical back: Neck supple.  Skin:    General: Skin is warm and dry.     Findings: No rash.  Neurological:     Mental Status: She is alert.     Cranial Nerves: No cranial nerve deficit (no facial droop, extraocular movements intact, no slurred speech).     Sensory: No sensory deficit.     Motor: No abnormal muscle tone or seizure activity.     Coordination: Coordination normal.     Deep Tendon Reflexes: Strength normal.  Psychiatric:        Mood and Affect: Mood and affect normal.     ED Results / Procedures / Treatments   Labs (all labs ordered are listed, but only abnormal results are displayed) Labs Reviewed  COMPREHENSIVE METABOLIC PANEL - Abnormal; Notable for the following components:      Result Value   Glucose, Bld 101 (*)    Creatinine, Ser 1.14 (*)    GFR, Estimated 52 (*)    All other components within normal limits  CBC - Abnormal; Notable for the following components:   WBC 10.6 (*)    All other components within normal limits  LIPASE, BLOOD  URINALYSIS, ROUTINE W REFLEX MICROSCOPIC    EKG None  Radiology CT ABDOMEN PELVIS WO CONTRAST  Result Date: 07/14/2020 CLINICAL DATA:  Diverticulitis suspected Patient reports intermittent diarrhea for a month and a half. Recent hospital admission for rectal bleeding and diarrhea. EXAM: CT ABDOMEN AND PELVIS WITHOUT CONTRAST TECHNIQUE: Multidetector CT imaging of the abdomen and  pelvis was performed following the standard protocol without IV contrast. COMPARISON:  None. FINDINGS: Lower chest: No focal consolidation or pleural fluid. Heart is normal in size. Hepatobiliary: No focal liver abnormality is seen. No gallstones, gallbladder wall thickening, or biliary dilatation. Pancreas: No ductal dilatation or inflammation. Spleen: Normal in size without focal abnormality. Adrenals/Urinary Tract: Normal adrenal glands. There is a 3 mm nonobstructing stone in the lower right kidney. No hydronephrosis or perinephric edema. Ureters are decompressed without stones along the course. Urinary bladder is physiologically distended. No wall thickening. Stomach/Bowel: Bowel evaluation is limited in the absence of contrast. There is circumferential rectal wall thickening, for example series 2, image 81. Minimal perirectal fat stranding, series 2, image 74. No perirectal fluid collection. The sigmoid colon is decompressed which limits assessment for additional sites of wall thickening. No significant diverticular disease. Small to moderate volume of stool throughout the remainder of the colon. Appendix tentatively but not definitively identified. Regardless, no appendicitis. Decompressed small bowel without obstruction or inflammatory change. Stomach is decompressed. Vascular/Lymphatic: Moderate aortic atherosclerosis. No aortic aneurysm. No bulky abdominopelvic adenopathy. There is a 9 mm right inguinal node that is nonspecific. No definite adenopathy in the rectal mesentery. Reproductive: Status post hysterectomy. No adnexal masses. Other: No free air, free fluid, or intra-abdominal fluid collection. Small fat containing umbilical hernia. Musculoskeletal: There are no acute or suspicious osseous abnormalities. IMPRESSION: 1. Circumferential rectal wall thickening with minimal perirectal fat stranding, suspicious for proctitis. Patient previously had rectal exam under anesthesia 04/24/2020, recommend  correlation with those findings. Neoplasm is not excluded on the current exam based on imaging findings alone. 2. Decompressed sigmoid colon, limiting assessment for or proximal wall thickening. No inflammatory change in the remainder of the colon. 3. Nonspecific 9  mm right inguinal lymph node. 4. Nonobstructing right renal stone. Aortic Atherosclerosis (ICD10-I70.0). Electronically Signed   By: Keith Rake M.D.   On: 07/14/2020 15:27    Procedures Procedures   Medications Ordered in ED Medications  sodium chloride 0.9 % bolus 1,000 mL (0 mLs Intravenous Stopped 07/14/20 1534)    Followed by  0.9 %  sodium chloride infusion (has no administration in time range)  morphine 4 MG/ML injection 4 mg (4 mg Intravenous Given 07/14/20 1411)  ondansetron (ZOFRAN) injection 4 mg (4 mg Intravenous Given 07/14/20 1411)    ED Course  I have reviewed the triage vital signs and the nursing notes.  Pertinent labs & imaging results that were available during my care of the patient were reviewed by me and considered in my medical decision making (see chart for details).  Clinical Course as of 07/14/20 1638  Mon Jul 14, 2020  1606 CBC shows elevated white blood cell compared to previous. [JK]  6283 Metabolic panel remarkable. [TD]  1761 Patient's CT scan shows some inflammation suggestive of proctitis.  No colitis or diverticulitis [JK]    Clinical Course User Index [JK] Dorie Rank, MD   MDM Rules/Calculators/A&P                          Patient presented to the ED for recurrent issues with diarrhea and rectal pain.  Patient was recently admitted to the hospital at another medical facility.  Patient ended up having CT scans and was treated with antibiotics.  Patient had been improving and followed up with her GI provider last week.  Following that visit patient started having recurrent episodes of rectal pain and diarrhea, mucoid in nature.   Patient did have a colonoscopy with her last  hospitalization.  She is comfortable here her vital signs are normal.  Labs showed a slight elevation of white blood cell count.  CT scan does show inflammation suggestive of proctitis.  This is most likely infectious in etiology.  I will give a prescription for Cipro and Flagyl.  I will ask her to follow-up with her GI doctor. Final Clinical Impression(s) / ED Diagnoses Final diagnoses:  Proctitis    Rx / DC Orders ED Discharge Orders         Ordered    ciprofloxacin (CIPRO) 500 MG tablet  2 times daily        07/14/20 1637    metroNIDAZOLE (FLAGYL) 500 MG tablet  3 times daily        07/14/20 1637           Dorie Rank, MD 07/14/20 [phone]

## 2020-07-14 NOTE — ED Notes (Signed)
Patient transported to CT 

## 2020-07-14 NOTE — Discharge Instructions (Addendum)
Take the medications as prescribed.  Follow-up with your GI doctor for further evaluation.  Return to the ED for fever worsening symptoms

## 2020-07-14 NOTE — ED Notes (Signed)
Patient back from CT.

## 2020-07-14 NOTE — Telephone Encounter (Signed)
Slides have been requested fax sent to [facility] pathology at [phone].    Lab orders have been entered.  I spoke with the pt and she is going to [facility] ED for evaluation.  She states she has constant diarrhea and is very weak.  FYI

## 2020-07-15 NOTE — Telephone Encounter (Signed)
Error the appt next week is for an MRI.  The pt is aware and states she will still come this week for labs and stool testing

## 2020-07-15 NOTE — Telephone Encounter (Signed)
Please let her know that her CT scan is much improved from when she was at [facility] in early January.  She had pancolitis then and only some inflammation in the rectum on CT scan here yesterday.  If she is still having diarrhea then she needs to do the stool studies and stuff as we requested in my message from last Friday (refer to that note for further details).

## 2020-07-15 NOTE — Telephone Encounter (Signed)
Left message on machine to call back  

## 2020-07-15 NOTE — Telephone Encounter (Signed)
The pt does continue to have diarrhea and agrees to come to our office for stool testing and labs as ordered. She is going to come this week and keep appt for next week with our office

## 2020-07-16 ENCOUNTER — Other Ambulatory Visit (INDEPENDENT_AMBULATORY_CARE_PROVIDER_SITE_OTHER): Payer: Medicare Other

## 2020-07-16 DIAGNOSIS — A09 Infectious gastroenteritis and colitis, unspecified: Secondary | ICD-10-CM | POA: Diagnosis not present

## 2020-07-16 LAB — HIGH SENSITIVITY CRP: CRP, High Sensitivity: 50.57 mg/L — ABNORMAL HIGH (ref 0.000–5.000)

## 2020-07-16 LAB — SEDIMENTATION RATE: Sed Rate: 16 mm/hr (ref 0–30)

## 2020-07-17 ENCOUNTER — Other Ambulatory Visit: Payer: Self-pay

## 2020-07-17 ENCOUNTER — Telehealth: Payer: Self-pay | Admitting: Gastroenterology

## 2020-07-17 LAB — CLOSTRIDIUM DIFFICILE TOXIN B, QUALITATIVE, REAL-TIME PCR: Toxigenic C. Difficile by PCR: DETECTED — AB

## 2020-07-17 MED ORDER — VANCOMYCIN HCL 125 MG PO CAPS: 125.0000 mg | ORAL_CAPSULE | Freq: Four times a day (QID) | ORAL | 0 refills | Status: AC

## 2020-07-17 NOTE — Telephone Encounter (Signed)
Pt called inquiring about treatment for C-diff as she just saw her lab results. She stated that the pain on her right side is very strong that she cannot lay on that side. Pls call her.

## 2020-07-17 NOTE — Telephone Encounter (Signed)
See results note 07/17/20.

## 2020-07-20 LAB — GI PROFILE, STOOL, PCR

## 2020-07-21 ENCOUNTER — Telehealth: Payer: Self-pay | Admitting: Gastroenterology

## 2020-07-21 NOTE — Telephone Encounter (Signed)
[facility] advised that the pt is aware and has been treated for C diff.

## 2020-07-21 NOTE — Telephone Encounter (Signed)
Inbound call from [place] stating patient's C difficile toxin A/B came back positive.

## 2020-07-22 ENCOUNTER — Ambulatory Visit (HOSPITAL_COMMUNITY)
Admission: RE | Admit: 2020-07-22 | Discharge: 2020-07-22 | Disposition: A | Discharge status: Home / Self Care | Payer: Medicare Other | Source: Ambulatory Visit | Attending: Gastroenterology | Admitting: Gastroenterology

## 2020-07-22 ENCOUNTER — Other Ambulatory Visit: Payer: Self-pay | Admitting: Gastroenterology

## 2020-07-22 ENCOUNTER — Other Ambulatory Visit: Payer: Self-pay

## 2020-07-22 DIAGNOSIS — A09 Infectious gastroenteritis and colitis, unspecified: Secondary | ICD-10-CM

## 2020-07-22 DIAGNOSIS — K838 Other specified diseases of biliary tract: Secondary | ICD-10-CM | POA: Insufficient documentation

## 2020-07-22 MED ORDER — GADOBUTROL 1 MMOL/ML IV SOLN
6.0000 mL | Freq: Once | INTRAVENOUS | Status: AC | PRN
  Administered 2020-07-22: 6 mL via INTRAVENOUS

## 2020-07-23 ENCOUNTER — Telehealth: Payer: Self-pay

## 2020-07-23 LAB — SURGICAL PATHOLOGY

## 2020-07-23 NOTE — Telephone Encounter (Signed)
The pt has been scheduled for 08/18/20 at 2 pm appt with Dr Henrene Pastor. She has been advised and states her diarrhea has improved greatly.  She says she feels much better and will call back if she does not continue to improve.

## 2020-07-23 NOTE — Telephone Encounter (Signed)
-----   Message from Loralie Champagne, PA-C sent at 07/23/2020  3:40 PM EST ----- As per Dr. Blanch Media recommendation below... MRCP showed a possible (but not definite) stone in her bile duct.  This could definitely account for the dilation in her bile ducts on recent imaging.  Her liver function tests are normal so it is certainly not causing any type of obstruction at this point, but it is possible that it could in the future.  Please schedule her to see Dr. Henrene Pastor in the office in about 4 weeks so that he can discuss options with her regarding removal of that.  Also, please get an update on her diarrhea.  Thank you,  Jess  ----- Message ----- From: Irene Shipper, MD Sent: 07/23/2020  12:58 PM EST To: Loralie Champagne, PA-C  Jessica, 1.  Please make sure she has a very adequate course of therapy for her C. Difficile 2.  Notify her of her MRCP results.  Possible but not definite stone 3.  Have her follow-up with me in the office in about 4 weeks.  I will discuss with her possible EUS/ERCP Thanks JP. ----- Message ----- From: Loralie Champagne, PA-C Sent: 07/23/2020   9:02 AM EST To: Irene Shipper, MD  Patient of yours who I saw for hospital follow-up of colitis/diarrhea.  CBD was noted to be dilated on multiple imaging studies although LFTs are normal.  I ordered MRI abdomen/MRCP and it showed the following.  She is currently being treated for C. difficile as her diarrhea had worsened again and stool studies were positive.  Should I put her on your schedule for ERCP?  Thank you,  Jess   ----- Message ----- From: Interface, Rad Results In Sent: 07/22/2020   4:59 PM EST To: Loralie Champagne, PA-C

## 2020-08-07 ENCOUNTER — Telehealth: Payer: Self-pay | Admitting: Internal Medicine

## 2020-08-07 MED ORDER — VANCOMYCIN HCL 125 MG PO CAPS: 125.0000 mg | ORAL_CAPSULE | Freq: Four times a day (QID) | ORAL | 0 refills | Status: DC

## 2020-08-07 NOTE — Telephone Encounter (Signed)
Pt states she was being treated for cdiff and finished the antibiotic a week ago. Last night she started having abd pain up high and some lower abd pain. Today  She has had 3 diarrhea stools. The odor is not like it was before but the stoll is granular as it was before. Please advise.

## 2020-08-07 NOTE — Telephone Encounter (Signed)
Possibly relapse.  Initiate vancomycin 125 mg 4 times daily for 2 weeks.

## 2020-08-07 NOTE — Telephone Encounter (Signed)
Spoke with pt and she is aware. Script sent to pharmacy. 

## 2020-08-18 ENCOUNTER — Ambulatory Visit (INDEPENDENT_AMBULATORY_CARE_PROVIDER_SITE_OTHER): Payer: Medicare Other | Admitting: Internal Medicine

## 2020-08-18 ENCOUNTER — Encounter: Payer: Self-pay | Admitting: Internal Medicine

## 2020-08-18 VITALS — BP 120/70 | HR 77 | Ht 64.0 in | Wt 125.0 lb

## 2020-08-18 DIAGNOSIS — R933 Abnormal findings on diagnostic imaging of other parts of digestive tract: Secondary | ICD-10-CM | POA: Diagnosis not present

## 2020-08-18 DIAGNOSIS — K838 Other specified diseases of biliary tract: Secondary | ICD-10-CM | POA: Diagnosis not present

## 2020-08-18 DIAGNOSIS — A0472 Enterocolitis due to Clostridium difficile, not specified as recurrent: Secondary | ICD-10-CM

## 2020-08-18 MED ORDER — VANCOMYCIN HCL 125 MG PO CAPS
125.0000 mg | ORAL_CAPSULE | Freq: Four times a day (QID) | ORAL | 0 refills | Status: DC
Start: 2020-08-18 — End: 2020-10-09

## 2020-08-18 NOTE — Patient Instructions (Signed)
We have sent the following medications to your pharmacy for you to pick up at your convenience: Vancomycin 125 mcg capsule- Take 1 capsule by mouth four times daily x 14 days  We will contact you regarding date/time/prep for EUS (endoscopic ultrasound) after Dr Henrene Pastor speaks with one of his colleagues regarding this.  If you are age 70 or older, your body mass index should be between 23-30. Your Body mass index is 21.46 kg/m. If this is out of the aforementioned range listed, please consider follow up with your Primary Care Provider.  Due to recent changes in healthcare laws, you may see the results of your imaging and laboratory studies on MyChart before your provider has had a chance to review them.  We understand that in some cases there may be results that are confusing or concerning to you. Not all laboratory results come back in the same time frame and the provider may be waiting for multiple results in order to interpret others.  Please give [place] 48 hours in order for your provider to thoroughly review all the results before contacting the office for clarification of your results.

## 2020-08-18 NOTE — Progress Notes (Signed)
HISTORY OF PRESENT ILLNESS:  Carol Scott is a 70 y.o. female who presents today for follow-up regarding treatment of recent C. difficile infection and abnormal biliary imaging.  I saw the patient in 2019 regarding intermittent rectal bleeding due to large prolapsed hemorrhoids, GERD, and transient lower abdominal/pelvic pain which have resolved.  She subsequently underwent complete colonoscopy January 04, 2018.  She was found to have a small sessile serrated polyp in addition to hemorrhoids.  Otherwise normal exam.  Follow-up in 5 years recommended.  Upper endoscopy at that same time was normal.  She was seen by the GI physician assistant July 09, 2020 regarding abdominal cramping with bloody diarrhea and a CT scan showing pancolitis.  GI pathogen panel revealed C. difficile.  There is no obvious risk factors such as recent antibiotic exposure.  In any event, she was treated with a 2-week course of vancomycin.  Diarrhea resolved.  2 weeks later she described recurrent diarrhea for which she was placed on vancomycin 125 mg 4 times daily for 2 weeks.  She has about 4 days of therapy remaining.  She tells me that her bowel habits have improved significantly.  On her CT scan elsewhere she was said to have a mildly dilated bile duct without acute abnormalities.  Liver tests at that time were normal.  Previous liver tests have been normal.  MRCP was performed.  Question of a 7 mm stone at the level of the ampulla was raised.  Gallbladder was normal without gallstones.  She does not have biliary pain.  No new complaints.  She has a number of excellent questions regarding all of her conditions.  She did see Dr. Dema Severin of general surgery recently regarding anal fissure.  I have reviewed that encounter dated July 30, 2020 REVIEW OF SYSTEMS:  All non-GI ROS negative unless otherwise stated in the HPI.    Past Medical History:  Diagnosis Date  . Anal fissure   . Anxiety   . Cervical spondylosis without  myelopathy   . 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 DDD (degenerative disc disease), cervical   . GERD (gastroesophageal reflux disease)   . Hemorrhoids   . History of concussion    04-22-2020 per pt hit head no loc 05/ 2021, residual balance issue and headaches (headaches resolved but still have balance issues)  . History of palpitations all test results in care everywhere  (04-22-2020  per pt all symptoms resolved)   per pt sent to cardiologist, dr Clayborn Bigness, 07/ 2021 (note in epic)  for palpatitions , poss. angia at rest, sob & r/o afib;  had 72 hour monitor (per note 04-07-2020 SR -- ST biphasic p waves no evidence afib/ flutter),  echo 03-28-2020 normal w/ ef >55%, and nuclear stress test 03-04-2020 borderline abnormal notable for poor uptake during rest , normal perfusion, and no evidence ischemia, ef 66%   . Hypertension    followed by pcp  . Multiple thyroid nodules    04-22-2020  per pt has had previous bx's which were benign , followed by pcp  . OA (osteoarthritis)    shoudlers, knees  . Perianal pain   . Wears glasses     Past Surgical History:  Procedure Laterality Date  . BOTOX INJECTION N/A 04/24/2020   Procedure: POSSIBLE BOTOX INJECTION;  Surgeon: Ileana Roup, MD;  Location: [place];  Service: General;  Laterality: N/A;  . COLONOSCOPY WITH PROPOFOL  01-04-2018  dr Henrene Pastor  . [place]  2005  . RECTAL EXAM UNDER ANESTHESIA N/A 04/24/2020   Procedure: ANORECTAL EXAM UNDER ANESTHESIA, EXCISION OF PERIANAL LESION; BOTOX INJECTION;  Surgeon: Ileana Roup, MD;  Location: [place];  Service: General;  Laterality: N/A;  . SUPRACERVICAL ABDOMINAL HYSTERECTOMY  2006   WITH BILATERAL SALPINGOOPHORECTOMY AND PELVIC FLOOR REPAIR (possible inculding sling)  . TONSILLECTOMY AND ADENOIDECTOMY  1960  . UPPER GASTROINTESTINAL ENDOSCOPY  01/06/2015     Social History Mirian Casco  reports that she quit smoking about 26 years ago. Her smoking use included cigarettes. She quit after 30.00 years of use. She has never used smokeless tobacco. She reports current alcohol use. She reports that she does not use drugs.  family history includes Breast cancer in her maternal aunt and mother; Diabetes in her father and mother; Heart attack in her brother; Heart disease in her father; Prostate cancer in her brother and father.  Allergies  Allergen Reactions  . Contrast Media [Iodinated Diagnostic Agents] Other (See Comments)    sneezing  . Iodine     Other reaction(s): Seizure  . Lisinopril     Other reaction(s): Cough  . Macrobid [Nitrofurantoin] Nausea Only    severe  . Metrizamide     Per does not remember reaction  . Moxifloxacin   . Prednisone     Per pt "red streaks on lowers legs"       PHYSICAL EXAMINATION: Vital signs: BP 120/70   Pulse 77   Ht 5\' 4"  (1.626 m)   Wt 125 lb (56.7 kg)   SpO2 98%   BMI 21.46 kg/m   Constitutional: generally well-appearing, no acute distress Psychiatric: alert and oriented x3, cooperative Eyes: extraocular movements intact, anicteric, conjunctiva pink Mouth: oral pharynx moist, no lesions Neck: supple no lymphadenopathy Cardiovascular: heart regular rate and rhythm, no murmur Lungs: clear to auscultation bilaterally Abdomen: soft, nontender, nondistended, no obvious ascites, no peritoneal signs, normal bowel sounds, no organomegaly Rectal: Omitted Extremities: no clubbing, cyanosis, or lower extremity edema bilaterally Skin: no lesions on visible extremities Neuro: No focal deficits.  Cranial nerves intact  ASSESSMENT:  1.  Clostridium difficile associated diarrhea.  Responded initially to a 2-week course of vancomycin.  Subsequent relapse and retreatment with vancomycin.  Significantly improved 2.  Question of common duct stone at the level of the ampulla in a patient with a  borderline dilated bile duct, normal gallbladder, no gallstones, no abdominal pain, and normal liver tests.  Rule out actual stone versus artifact.   PLAN:  1.  Recommend additional 2 weeks of vancomycin 125 mg 4 times daily to reduce the risk of relapsing again. 2.  Discussed in great detail (including drawings) the issues regarding possible choledocholithiasis and the role of ERCP.  We also discussed the role of EUS in cases where the absolute certainty of pathology does not exist.  As such, I recommend that endoscopic ultrasound with the option to proceed to ERCP if common duct stone identified.  No common duct stone identified pathology, though the need for ERCP in the associated risk is obviated.The nature of the procedure, as well as the risks, benefits, and alternatives were carefully and thoroughly reviewed with the patient. Ample time for discussion and questions allowed. The patient understood, was satisfied, and agreed to proceed. 3.  I will discuss her case with my EUS colleagues who can assist her evaluation. Total time of 40 minutes was spent preparing to see the patient,  reviewing test, obtaining comprehensive history, performing medically appropriate physical examination, counseling patient regarding the above listed issues, ordering advanced procedures, and documenting clinical information in the health record

## 2020-08-19 ENCOUNTER — Other Ambulatory Visit: Payer: Self-pay

## 2020-08-19 ENCOUNTER — Telehealth: Payer: Self-pay

## 2020-08-19 DIAGNOSIS — K838 Other specified diseases of biliary tract: Secondary | ICD-10-CM

## 2020-08-19 NOTE — Telephone Encounter (Signed)
----- Message from Milus Banister, MD sent at 08/18/2020  7:57 PM EST ----- Regarding: RE: EUS/ERCP I looked at the MR images and report. Bertis Ruddy is a very good MRI body radiologist and so I do suspect there is indeed something wrong in the distal bile duct. Stone vs. Sludge vs. Neoplasm.   I agree she needs interval LFTs.  Elliott Quade, If the case is booked with me, please allow time for both full EUS and ERCP just in case this requires spyglass, FNA, etc.   Thanks all     ----- Message ----- From: Irene Shipper, MD Sent: 08/18/2020   5:32 PM EST To: Milus Banister, MD, Timothy Lasso, RN, # Subject: RE: EUS/ERCP                                   Thank you Gabe ----- Message ----- From: Irving Copas., MD Sent: 08/18/2020   5:23 PM EST To: Milus Banister, MD, Irene Shipper, MD, # Subject: RE: EUS/ERCP                                   JP, Not an unreasonable request although the scheduling of our double time slots are usually much more difficult to come by.  Certainly the MRI is much more suggestive in the setting of the meniscus-like area that causes the CBD to be higher up than the PD origination that there likely is a stone there at least in February.  I think we can work on trying to get her scheduled for an EUS/ERCP at one of our next available slots but in the interim I would probably get repeat LFTs because if the LFTs have now increased (since they were normal previously) then going directly to ERCP probably seems reasonable from that standpoint. Arthuro Canelo, please offer the patient next available EUS/ERCP (only need to put normal ERCP time) since the EUS would be focused to the bile duct.  Also would have her come in for LFTs in the next 1 to 2 weeks. Thanks. GM  ----- Message ----- From: Irene Shipper, MD Sent: 08/18/2020   5:14 PM EST To: Milus Banister, MD, # Subject: Stevphen Meuse,  This is a 70 year old female who was seen in  the office late January by one of our extenders regarding acute colitis, which turned out to be C. difficile which is being treated.  In addition to colitis, the CT scan, performed elsewhere, raised the question of borderline dilated bile duct.  Based on this report, the advanced practitioner ordered an MRI/MRCP.  On MRCP, there was no intrahepatic ductal dilation.  Common bile duct was 9 mm.  However, there was said to be a "suspected" 7 mm stone at the level of the ampulla.  She has no gallstones.  She has no biliary pain.  Her liver tests have been repeatedly normal.  I saw her in the office today for follow-up of her infectious colitis and to discuss the implications of the radiographic findings.  I have had 2 similar patients recently (1 of which I discussed with Gabe) that I went  straight to ERCP.  Neither patient had a common duct stone.  Fortunately, both did well without complication.  I discussed various available options with this patient.  I favor EUS with the option to proceed to same-day ERCP if positive.  She agrees with this approach.  If you agree, then I told her we would be reaching out to schedule the examination(s).  Thanks as always for your help.  JP

## 2020-08-19 NOTE — Telephone Encounter (Signed)
EUS ERCP scheduled for 10/09/20 at [facility] with Dr Ardis Hughs at 830 am COVID test on 4/25/22at 1040 am. Lab order entered.   Left message on machine to call back

## 2020-08-19 NOTE — Telephone Encounter (Signed)
Patient returned your call, please call patient one more time.   

## 2020-08-20 NOTE — Telephone Encounter (Signed)
EUS/ERCP scheduled, pt instructed and medications reviewed.  Patient instructions mailed to home.  Patient to call with any questions or concerns. The pt will also come in for labs in 1-2 weeks.

## 2020-09-02 ENCOUNTER — Other Ambulatory Visit (INDEPENDENT_AMBULATORY_CARE_PROVIDER_SITE_OTHER): Payer: Medicare Other

## 2020-09-02 DIAGNOSIS — K838 Other specified diseases of biliary tract: Secondary | ICD-10-CM

## 2020-09-02 LAB — HEPATIC FUNCTION PANEL
ALT: 20 U/L (ref 0–35)
AST: 22 U/L (ref 0–37)
Albumin: 4.4 g/dL (ref 3.5–5.2)
Alkaline Phosphatase: 66 U/L (ref 39–117)
Bilirubin, Direct: 0.1 mg/dL (ref 0.0–0.3)
Total Bilirubin: 0.5 mg/dL (ref 0.2–1.2)
Total Protein: 7 g/dL (ref 6.0–8.3)

## 2020-10-03 ENCOUNTER — Encounter (HOSPITAL_COMMUNITY): Payer: Self-pay | Admitting: Gastroenterology

## 2020-10-03 ENCOUNTER — Other Ambulatory Visit: Payer: Self-pay

## 2020-10-06 ENCOUNTER — Other Ambulatory Visit (HOSPITAL_COMMUNITY)
Admission: RE | Admit: 2020-10-06 | Discharge: 2020-10-06 | Disposition: A | Discharge status: Home / Self Care | Payer: Medicare Other | Source: Ambulatory Visit | Attending: Gastroenterology | Admitting: Gastroenterology

## 2020-10-06 DIAGNOSIS — Z01812 Encounter for preprocedural laboratory examination: Secondary | ICD-10-CM | POA: Insufficient documentation

## 2020-10-06 DIAGNOSIS — Z20822 Contact with and (suspected) exposure to covid-19: Secondary | ICD-10-CM | POA: Diagnosis not present

## 2020-10-07 LAB — SARS CORONAVIRUS 2 (TAT 6-24 HRS): SARS Coronavirus 2: NEGATIVE

## 2020-10-08 ENCOUNTER — Encounter (HOSPITAL_COMMUNITY): Payer: Self-pay | Admitting: Gastroenterology

## 2020-10-09 ENCOUNTER — Encounter (HOSPITAL_COMMUNITY)
Admission: RE | Disposition: A | Discharge status: Home / Self Care | Payer: Self-pay | Source: Home / Self Care | Attending: Gastroenterology

## 2020-10-09 ENCOUNTER — Ambulatory Visit (HOSPITAL_COMMUNITY): Payer: Medicare Other | Admitting: Anesthesiology

## 2020-10-09 ENCOUNTER — Other Ambulatory Visit: Payer: Self-pay

## 2020-10-09 ENCOUNTER — Ambulatory Visit (HOSPITAL_COMMUNITY)
Admission: RE | Admit: 2020-10-09 | Discharge: 2020-10-09 | Disposition: A | Discharge status: Home / Self Care | Payer: Medicare Other | Attending: Gastroenterology | Admitting: Gastroenterology

## 2020-10-09 ENCOUNTER — Encounter (HOSPITAL_COMMUNITY): Payer: Self-pay | Admitting: Gastroenterology

## 2020-10-09 DIAGNOSIS — Z91041 Radiographic dye allergy status: Secondary | ICD-10-CM | POA: Diagnosis not present

## 2020-10-09 DIAGNOSIS — I10 Essential (primary) hypertension: Secondary | ICD-10-CM | POA: Diagnosis not present

## 2020-10-09 DIAGNOSIS — Z87891 Personal history of nicotine dependence: Secondary | ICD-10-CM | POA: Insufficient documentation

## 2020-10-09 DIAGNOSIS — Z888 Allergy status to other drugs, medicaments and biological substances status: Secondary | ICD-10-CM | POA: Insufficient documentation

## 2020-10-09 DIAGNOSIS — Z881 Allergy status to other antibiotic agents status: Secondary | ICD-10-CM | POA: Insufficient documentation

## 2020-10-09 DIAGNOSIS — K219 Gastro-esophageal reflux disease without esophagitis: Secondary | ICD-10-CM | POA: Insufficient documentation

## 2020-10-09 DIAGNOSIS — Z833 Family history of diabetes mellitus: Secondary | ICD-10-CM | POA: Diagnosis not present

## 2020-10-09 DIAGNOSIS — K838 Other specified diseases of biliary tract: Secondary | ICD-10-CM

## 2020-10-09 DIAGNOSIS — Z8042 Family history of malignant neoplasm of prostate: Secondary | ICD-10-CM | POA: Insufficient documentation

## 2020-10-09 DIAGNOSIS — K5710 Diverticulosis of small intestine without perforation or abscess without bleeding: Secondary | ICD-10-CM | POA: Insufficient documentation

## 2020-10-09 DIAGNOSIS — K529 Noninfective gastroenteritis and colitis, unspecified: Secondary | ICD-10-CM | POA: Diagnosis present

## 2020-10-09 DIAGNOSIS — Z803 Family history of malignant neoplasm of breast: Secondary | ICD-10-CM | POA: Diagnosis not present

## 2020-10-09 DIAGNOSIS — Z8249 Family history of ischemic heart disease and other diseases of the circulatory system: Secondary | ICD-10-CM | POA: Insufficient documentation

## 2020-10-09 HISTORY — PX: EUS: SHX5427

## 2020-10-09 HISTORY — DX: Other specified postprocedural states: Z98.890

## 2020-10-09 HISTORY — PX: ESOPHAGOGASTRODUODENOSCOPY (EGD) WITH PROPOFOL: SHX5813

## 2020-10-09 HISTORY — DX: Nausea with vomiting, unspecified: R11.2

## 2020-10-09 SURGERY — UPPER ENDOSCOPIC ULTRASOUND (EUS) RADIAL
Anesthesia: General

## 2020-10-09 MED ORDER — MIDAZOLAM HCL 2 MG/2ML IJ SOLN
INTRAMUSCULAR | Status: AC
  Filled 2020-10-09: qty 2

## 2020-10-09 MED ORDER — LACTATED RINGERS IV SOLN: INTRAVENOUS | Status: DC

## 2020-10-09 MED ORDER — PROPOFOL 500 MG/50ML IV EMUL
INTRAVENOUS | Status: DC | PRN
  Administered 2020-10-09: 125 ug/kg/min via INTRAVENOUS

## 2020-10-09 MED ORDER — MIDAZOLAM HCL 2 MG/2ML IJ SOLN
INTRAMUSCULAR | Status: DC | PRN
  Administered 2020-10-09: 2 mg via INTRAVENOUS

## 2020-10-09 MED ORDER — PROPOFOL 10 MG/ML IV BOLUS
INTRAVENOUS | Status: DC | PRN
  Administered 2020-10-09: 30 mg via INTRAVENOUS

## 2020-10-09 MED ORDER — LIDOCAINE 2% (20 MG/ML) 5 ML SYRINGE
INTRAMUSCULAR | Status: DC | PRN
  Administered 2020-10-09: 60 mg via INTRAVENOUS

## 2020-10-09 MED ORDER — SCOPOLAMINE 1 MG/3DAYS TD PT72
MEDICATED_PATCH | TRANSDERMAL | Status: DC | PRN
  Administered 2020-10-09: 1 via TRANSDERMAL

## 2020-10-09 MED ORDER — PROPOFOL 500 MG/50ML IV EMUL
INTRAVENOUS | Status: AC
  Filled 2020-10-09: qty 150

## 2020-10-09 MED ORDER — SCOPOLAMINE 1 MG/3DAYS TD PT72
MEDICATED_PATCH | TRANSDERMAL | Status: AC
  Filled 2020-10-09: qty 1

## 2020-10-09 NOTE — Op Note (Signed)
[facility] Patient Name: Carol Scott Procedure Date: 10/09/2020 MRN: [id number] Attending MD: Milus Banister , MD Date of Birth: [birth year] CSN: [id number] Age: 70 Admit Type: Outpatient Procedure:                Upper EUS Indications:              Colitis symptoms led to CT scan which showed                            dilated CBD. Follow up MRI/MRCP "suspected 47mm                            distal CBD stone" in slightly dilated CBD, no                            stones in GB. Persistently normal LFTs. No biliary                            symptoms Providers:                Milus Banister, MD, Elmer Ramp. Tilden Dome, RN, Ladona Ridgel, Technician, Herbie Drape, CRNA Referring MD:             Scarlette Shorts, MD Medicines:                Monitored Anesthesia Care Complications:            No immediate complications. Estimated blood loss:                            None. Estimated Blood Loss:     Estimated blood loss: none. Procedure:                Pre-Anesthesia Assessment:                           - Prior to the procedure, a History and Physical                            was performed, and patient medications and                            allergies were reviewed. The patient's tolerance of                            previous anesthesia was also reviewed. The risks                            and benefits of the procedure and the sedation                            options and risks were discussed with the patient.  All questions were answered, and informed consent                            was obtained. Prior Anticoagulants: The patient has                            taken no previous anticoagulant or antiplatelet                            agents. ASA Grade Assessment: II - A patient with                            mild systemic disease. After reviewing the risks                            and benefits, the patient  was deemed in                            satisfactory condition to undergo the procedure.                           After obtaining informed consent, the endoscope was                            passed under direct vision. Throughout the                            procedure, the patient's blood pressure, pulse, and                            oxygen saturations were monitored continuously. The                            [id number] ([id number]) Olympus Radial EUS was                            introduced through the mouth, and advanced to the                            second part of duodenum. The upper EUS was                            accomplished without difficulty. The patient                            tolerated the procedure well. Scope In: Scope Out: Findings:      ENDOSCOPIC FINDING (with duodenoscope and radial echoendoscope): :      The examined esophagus was endoscopically normal.      The entire examined stomach was endoscopically normal.      The major papilla was somewhat small and slightly hooded. There was a       small perimpullary diverticulum about 1cm cephalad to the major papilla.      ENDOSONOGRAPHIC FINDING: :      1. CBD  was 44mm diameter maximally and contained no stones or soft tissue       masses.      2. Main pancreatic duct was normal, non-dilated.      3. Pancreatic parenchyma was normal throughout the gland.      4. Limited views of the gallbladder, liver, spleen, portal and splenic       vessels were all normal. Impression:               - CBD was 32mm and contained no stones or soft                            tissue masses. There was a small periampullary                            diverticulum about 1cm cephalad to the major                            papilla which was seen best with side viewing                            duodenoscope. This may have contributed to a false                            positive MRCP. Moderate Sedation:      Not Applicable -  Patient had care per Anesthesia. Recommendation:           - Discharge patient to home.                           - Follow clinically. Procedure Code(s):        --- Professional ---                           [phone], Esophagogastroduodenoscopy, flexible,                            transoral; with endoscopic ultrasound examination                            limited to the esophagus, stomach or duodenum, and                            adjacent structures Diagnosis Code(s):        --- Professional ---                           K83.8, Other specified diseases of biliary tract CPT copyright 2019 American Medical Association. All rights reserved. The codes documented in this report are preliminary and upon coder review may  be revised to meet current compliance requirements. Milus Banister, MD 10/09/2020 9:13:38 AM This report has been signed electronically. Number of Addenda: 0

## 2020-10-09 NOTE — Transfer of Care (Signed)
Immediate Anesthesia Transfer of Care Note  Patient: Carol Scott  Procedure(s) Performed: UPPER ENDOSCOPIC ULTRASOUND (EUS) RADIAL (N/A ) ENDOSCOPIC RETROGRADE CHOLANGIOPANCREATOGRAPHY (ERCP) WITH PROPOFOL (N/A )  Patient Location: PACU  Anesthesia Type:MAC  Level of Consciousness: sedated, patient cooperative and responds to stimulation  Airway & Oxygen Therapy: Patient Spontanous Breathing and Patient connected to face mask oxygen  Post-op Assessment: Report given to RN and Post -op Vital signs reviewed and stable  Post vital signs: Reviewed and stable  Last Vitals:  Vitals Value Taken Time  BP 101/44 10/09/20 0858  Temp    Pulse 75 10/09/20 0859  Resp 13 10/09/20 0859  SpO2 100 % 10/09/20 0859  Vitals shown include unvalidated device data.  Last Pain:  Vitals:   10/09/20 0730  TempSrc: Oral  PainSc: 0-No pain         Complications: No complications documented.

## 2020-10-09 NOTE — Anesthesia Postprocedure Evaluation (Signed)
Anesthesia Post Note  Patient: Carol Scott  Procedure(s) Performed: UPPER ENDOSCOPIC ULTRASOUND (EUS) RADIAL (N/A ) ENDOSCOPIC RETROGRADE CHOLANGIOPANCREATOGRAPHY (ERCP) WITH PROPOFOL (N/A )     Patient location during evaluation: PACU Anesthesia Type: MAC Level of consciousness: awake and alert Pain management: pain level controlled Vital Signs Assessment: post-procedure vital signs reviewed and stable Respiratory status: spontaneous breathing, nonlabored ventilation, respiratory function stable and patient connected to nasal cannula oxygen Cardiovascular status: blood pressure returned to baseline and stable Postop Assessment: no apparent nausea or vomiting Anesthetic complications: no   No complications documented.  Last Vitals:  Vitals:   10/09/20 0910 10/09/20 0921  BP: (!) 111/54 (!) 124/51  Pulse: 75 71  Resp: 18 15  Temp:    SpO2: 99% 100%    Last Pain:  Vitals:   10/09/20 0921  TempSrc:   PainSc: 0-No pain                 Effie Berkshire

## 2020-10-09 NOTE — H&P (Signed)
HPI: This is a woman with abnormal MRCP, normal LFTS, no biliary pains   ROS: complete GI ROS as described in HPI, all other review negative.  Constitutional:  No unintentional weight loss   Past Medical History:  Diagnosis Date  . Anal fissure   . Anxiety   . Cervical spondylosis without myelopathy   . 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 DDD (degenerative disc disease), cervical   . GERD (gastroesophageal reflux disease)   . Hemorrhoids   . History of concussion    04-22-2020 per pt hit head no loc 05/ 2021, residual balance issue and headaches (headaches resolved but still have balance issues)  . History of palpitations all test results in care everywhere  (04-22-2020  per pt all symptoms resolved)   per pt sent to cardiologist, dr Clayborn Bigness, 07/ 2021 (note in epic)  for palpatitions , poss. angia at rest, sob & r/o afib;  had 72 hour monitor (per note 04-07-2020 SR -- ST biphasic p waves no evidence afib/ flutter),  echo 03-28-2020 normal w/ ef >55%, and nuclear stress test 03-04-2020 borderline abnormal notable for poor uptake during rest , normal perfusion, and no evidence ischemia, ef 66%   . Hypertension    followed by pcp  . Multiple thyroid nodules    04-22-2020  per pt has had previous bx's which were benign , followed by pcp  . OA (osteoarthritis)    shoudlers, knees  . Perianal pain   . Wears glasses     Past Surgical History:  Procedure Laterality Date  . BOTOX INJECTION N/A 04/24/2020   Procedure: POSSIBLE BOTOX INJECTION;  Surgeon: Ileana Roup, MD;  Location: [facility];  Service: General;  Laterality: N/A;  . COLONOSCOPY WITH PROPOFOL  01-04-2018  dr Henrene Pastor  . [place]  2005  . RECTAL EXAM UNDER ANESTHESIA N/A 04/24/2020   Procedure: ANORECTAL EXAM UNDER ANESTHESIA, EXCISION OF PERIANAL LESION; BOTOX INJECTION;  Surgeon: Ileana Roup, MD;   Location: [place];  Service: General;  Laterality: N/A;  . SUPRACERVICAL ABDOMINAL HYSTERECTOMY  2006   WITH BILATERAL SALPINGOOPHORECTOMY AND PELVIC FLOOR REPAIR (possible inculding sling)  . TONSILLECTOMY AND ADENOIDECTOMY  1960  . UPPER GASTROINTESTINAL ENDOSCOPY  01/06/2015    No current facility-administered medications for this encounter.    Allergies as of 08/19/2020 - Review Complete 08/18/2020  Allergen Reaction Noted  . Contrast media [iodinated diagnostic agents] Other (See Comments) 12/31/2014  . Iodine  08/18/2020  . Lisinopril  12/29/2017  . Macrobid [nitrofurantoin] Nausea Only 04/22/2020  . Metrizamide  04/22/2020  . Moxifloxacin  12/29/2017  . Prednisone  09/20/2017    Family History  Problem Relation Age of Onset  . Breast cancer Mother   . Diabetes Mother   . Prostate cancer Father   . Diabetes Father   . Heart disease Father   . Prostate cancer Brother   . Breast cancer Maternal Aunt   . Heart attack Brother   . Colon cancer Neg Hx   . Esophageal cancer Neg Hx   . Rectal cancer Neg Hx   . Stomach cancer Neg Hx     Social History   Socioeconomic History  . Marital status: Widowed    Spouse name: Not on file  . Number of children: 3  . Years of education: Not on file  . Highest education level: Not on file  Occupational History  .  Occupation: retired  Tobacco Use  . Smoking status: Former Smoker    Years: 30.00    Types: Cigarettes    Quit date: 04/22/1994    Years since quitting: 26.4  . Smokeless tobacco: Never Used  Vaping Use  . Vaping Use: Never used  Substance and Sexual Activity  . Alcohol use: Yes    Comment: socially  . Drug use: Never  . Sexual activity: Not on file  Other Topics Concern  . Not on file  Social History Narrative  . Not on file   Social Determinants of Health   Financial Resource Strain: Not on file  Food Insecurity: Not on file  Transportation Needs: Not on file  Physical Activity: Not  on file  Stress: Not on file  Social Connections: Not on file  Intimate Partner Violence: Not on file     Physical Exam: Ht 5\' 4"  (1.626 m)   Wt 57.6 kg   BMI 21.80 kg/m  Constitutional: generally well-appearing Psychiatric: alert and oriented x3 Abdomen: soft, nontender, nondistended, no obvious ascites, no peritoneal signs, normal bowel sounds No peripheral edema noted in lower extremities  Assessment and plan: 70 y.o. female with abnormal MR ('suggestion of distal CBD stone' per radiologist), normal LFTs, no biliary pains  EUs +/- ERCP today  Please see the "Patient Instructions" section for addition details about the plan.  Owens Loffler, MD [facility] 10/09/2020, 7:19 AM

## 2020-10-09 NOTE — Discharge Instructions (Signed)
YOU HAD AN ENDOSCOPIC PROCEDURE TODAY: Refer to the procedure report and other information in the discharge instructions given to you for any specific questions about what was found during the examination. If this information does not answer your questions, please call [facility] office at [phone] to clarify.   YOU SHOULD EXPECT: Some feelings of bloating in the abdomen. Passage of more gas than usual. Walking can help get rid of the air that was put into your GI tract during the procedure and reduce the bloating. If you had a lower endoscopy (such as a colonoscopy or flexible sigmoidoscopy) you may notice spotting of blood in your stool or on the toilet paper. Some abdominal soreness may be present for a day or two, also.  DIET: Your first meal following the procedure should be a light meal and then it is ok to progress to your normal diet. A half-sandwich or bowl of soup is an example of a good first meal. Heavy or fried foods are harder to digest and may make you feel nauseous or bloated. Drink plenty of fluids but you should avoid alcoholic beverages for 24 hours. If you had a esophageal dilation, please see attached instructions for diet.    ACTIVITY: Your care partner should take you home directly after the procedure. You should plan to take it easy, moving slowly for the rest of the day. You can resume normal activity the day after the procedure however YOU SHOULD NOT DRIVE, use power tools, machinery or perform tasks that involve climbing or major physical exertion for 24 hours (because of the sedation medicines used during the test).   SYMPTOMS TO REPORT IMMEDIATELY: A gastroenterologist can be reached at any hour. Please call [phone]  for any of the following symptoms:   Following upper endoscopy (EGD, EUS, ERCP, esophageal dilation) Vomiting of blood or coffee ground material  New, significant abdominal pain  New, significant chest pain or pain under the shoulder blades  Painful or  persistently difficult swallowing  New shortness of breath  Black, tarry-looking or red, bloody stools  FOLLOW UP:  If any biopsies were taken you will be contacted by phone or by letter within the next 1-3 weeks. Call [phone]  if you have not heard about the biopsies in 3 weeks.  Please also call with any specific questions about appointments or follow up tests.  

## 2020-10-09 NOTE — Anesthesia Preprocedure Evaluation (Addendum)
Anesthesia Evaluation  Patient identified by MRN, date of birth, ID band Patient awake    Reviewed: Allergy & Precautions, NPO status , Patient's Chart, lab work & pertinent test results  History of Anesthesia Complications (+) PONV and history of anesthetic complications  Airway Mallampati: I  TM Distance: >3 FB Neck ROM: Full    Dental  (+) Teeth Intact, Chipped,    Pulmonary former smoker,    Pulmonary exam normal        Cardiovascular hypertension,  Rhythm:Regular Rate:Normal     Neuro/Psych Anxiety negative neurological ROS     GI/Hepatic Neg liver ROS, GERD  ,  Endo/Other  negative endocrine ROS  Renal/GU negative Renal ROS     Musculoskeletal  (+) Arthritis ,   Abdominal Normal abdominal exam  (+)   Peds  Hematology negative hematology ROS (+)   Anesthesia Other Findings   Reproductive/Obstetrics                            Anesthesia Physical Anesthesia Plan  ASA: II  Anesthesia Plan: General   Post-op Pain Management:    Induction: Intravenous  PONV Risk Score and Plan: 0 and Propofol infusion, Ondansetron, Midazolam and Scopolamine patch - Pre-op  Airway Management Planned: Oral ETT  Additional Equipment: None  Intra-op Plan:   Post-operative Plan: Extubation in OR  Informed Consent: I have reviewed the patients History and Physical, chart, labs and discussed the procedure including the risks, benefits and alternatives for the proposed anesthesia with the patient or authorized representative who has indicated his/her understanding and acceptance.       Plan Discussed with: CRNA  Anesthesia Plan Comments:        Anesthesia Quick Evaluation

## 2020-10-10 ENCOUNTER — Encounter (HOSPITAL_COMMUNITY): Payer: Self-pay | Admitting: Gastroenterology

## 2021-04-03 NOTE — Progress Notes (Signed)
Office Visit Note  Patient: Carol Scott             Date of Birth: 04/20/1951           MRN: [id number]             PCP: Clinton Quant, MD Referring: Clinton Quant, MD Visit Date: 04/14/2021 Occupation: @GUAROCC @  Subjective:  Pain in multiple joints.   History of Present Illness: Carol Scott is a 70 y.o. female seen in consultation per request of her PCP.  According the patient her symptoms a started many years ago with heaviness in her arms and her legs.  She states the symptoms were so severe that she had to lean over or stop driving.  5 years ago she was seen by her PCP and her labs were abnormal.  She was referred to a rheumatologist in [place] Dr. Scarlette Shorts who did extensive lab work and ultrasound of her hands.  He diagnosed her with this connective tissue disease and placed on hydroxychloroquine.  She states she did really well on hydroxychloroquine and only had 2 episodes since then.  She states in December 2021 she was hospitalized with pancolitis she also developed C. difficile after that.  She does not require any GI follow-up.  She has been experiencing increased fatigue and increased joint pain since then.  She describes discomfort in her cervical spine, lumbar spine, shoulders, wrists, hands, hips, knees, ankles and feet.  She has not noticed any joint swelling.  She has chronic left shoulder pain due to prior rotator cuff tear.  She gets some swelling in her ankles which she relates to pedal edema.  Gives history of fatigue, dry mouth and dry eyes, arthralgias and myalgias.  There is no history of malar rash, photosensitivity, Raynaud's phenomenon or lymphadenopathy.  There is no family history of autoimmune disease.  She is gravida 3 and para 3.  No history of DVTs.  Activities of Daily Living:  Patient reports morning stiffness for 5-10 minutes.   Patient Reports nocturnal pain.  Difficulty dressing/grooming: Denies Difficulty climbing stairs: Denies Difficulty  getting out of chair: Denies Difficulty using hands for taps, buttons, cutlery, and/or writing: Reports  Review of Systems  Constitutional:  Positive for fatigue. Negative for night sweats, weight gain and weight loss.  HENT:  Positive for mouth dryness and nose dryness. Negative for mouth sores, trouble swallowing and trouble swallowing.        Nose sore  Eyes:  Positive for itching. Negative for pain, redness, visual disturbance and dryness.  Respiratory:  Negative for cough, shortness of breath and difficulty breathing.   Cardiovascular:  Negative for chest pain, palpitations, hypertension, irregular heartbeat and swelling in legs/feet.  Gastrointestinal:  Negative for blood in stool, constipation and diarrhea.  Endocrine: Negative for increased urination.  Genitourinary:  Negative for difficulty urinating and vaginal dryness.  Musculoskeletal:  Positive for joint pain, joint pain, myalgias, morning stiffness, muscle tenderness and myalgias. Negative for joint swelling and muscle weakness.  Skin:  Negative for color change, rash, hair loss, redness, skin tightness, ulcers and sensitivity to sunlight.  Allergic/Immunologic: Negative for susceptible to infections.  Neurological:  Positive for headaches and weakness. Negative for dizziness, numbness, memory loss and night sweats.  Hematological:  Positive for bruising/bleeding tendency. Negative for swollen glands.  Psychiatric/Behavioral:  Positive for sleep disturbance. Negative for depressed mood and confusion. The patient is not nervous/anxious.    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 angia at rest, sob & r/o afib;  had 72 hour monitor (per note 04-07-2020 SR -- ST biphasic p waves no evidence afib/ flutter),  echo 03-28-2020 normal w/ ef >55%, and nuclear stress test 03-04-2020 borderline abnormal notable for poor uptake during rest , normal perfusion, and no evidence ischemia, ef 66%    Hypertension    followed by pcp   Multiple thyroid nodules    04-22-2020  per pt has had previous bx's which were benign , followed by pcp   OA (osteoarthritis)    shoudlers, knees   Perianal pain    PONV (postoperative nausea and vomiting)    Wears glasses     Family History  Problem Relation Age of Onset   Breast cancer Mother    Diabetes Mother    Prostate cancer Father    Diabetes Father    Heart disease Father    Breast cancer Sister    Dementia Sister    Prostate cancer Brother    Breast cancer Maternal Aunt    Healthy Son    Healthy Son    Healthy Daughter    Colon cancer Neg Hx    Esophageal cancer Neg Hx    Rectal cancer Neg Hx    Stomach cancer Neg Hx    Past Surgical History:  Procedure Laterality Date   BOTOX INJECTION N/A 04/24/2020   Procedure: POSSIBLE BOTOX INJECTION;  Surgeon: Ileana Roup, MD;  Location: [facility];  Service: General;  Laterality: N/A;   COLONOSCOPY WITH PROPOFOL  01-04-2018  dr Henrene Pastor   ESOPHAGOGASTRODUODENOSCOPY (EGD) WITH PROPOFOL N/A 10/09/2020   Procedure: ESOPHAGOGASTRODUODENOSCOPY  (EGD) WITH PROPOFOL;  Surgeon: Milus Banister, MD;  Location: [facility];  Service: Endoscopy;  Laterality: N/A;   EUS N/A 10/09/2020   Procedure: UPPER ENDOSCOPIC ULTRASOUND (EUS) RADIAL;  Surgeon: Milus Banister, MD;  Location: [facility];  Service: Endoscopy;  Laterality: N/A;   HEMORRHOID SURGERY  2005   RECTAL EXAM UNDER ANESTHESIA N/A 04/24/2020   Procedure: ANORECTAL EXAM UNDER ANESTHESIA, EXCISION OF PERIANAL LESION; BOTOX INJECTION;  Surgeon: Ileana Roup, MD;  Location: [facility];  Service: General;  Laterality: N/A;   SUPRACERVICAL ABDOMINAL HYSTERECTOMY  2006   WITH BILATERAL SALPINGOOPHORECTOMY AND PELVIC FLOOR REPAIR (possible inculding sling)   TONSILLECTOMY AND ADENOIDECTOMY  1960   UPPER GASTROINTESTINAL ENDOSCOPY  01/06/2015   Social History   Social History Narrative   Not on file   Immunization History  Administered Date(s) Administered   Moderna Sars-Covid-2 Vaccination 08/17/2019, 09/14/2019, 05/16/2020  Objective: Vital Signs: BP (!) 149/74 (BP Location: Right Arm, Patient Position: Sitting, Cuff Size: Normal)   Pulse 81   Ht 5' 3.5" (1.613 m)   Wt 130 lb (59 kg)   BMI 22.67 kg/m    Physical Exam Vitals and nursing note reviewed.  Constitutional:      Appearance: She is well-developed.  HENT:     Head: Normocephalic and atraumatic.  Eyes:     Conjunctiva/sclera: Conjunctivae normal.  Cardiovascular:     Rate and Rhythm: Normal rate and regular rhythm.     Heart sounds: Normal heart sounds.  Pulmonary:     Effort: Pulmonary effort is normal.     Breath sounds: Normal breath sounds.  Abdominal:     General: Bowel sounds are normal.     Palpations: Abdomen is soft.  Musculoskeletal:     Cervical back: Normal range of motion.  Lymphadenopathy:     Cervical: No cervical adenopathy.  Skin:    General: Skin is warm and dry.     Capillary Refill: Capillary refill takes less than 2 seconds.  Neurological:      Mental Status: She is alert and oriented to person, place, and time.  Psychiatric:        Behavior: Behavior normal.     Musculoskeletal Exam: C-spine thoracic and lumbar spine were in good range of motion.  She has some discomfort with range of motion of her lumbar spine.  Shoulder joints, elbow joints, wrist joints, MCPs PIPs and DIPs with good range of motion without any synovitis.  Mild PIP and DIP thickening was noted.  Hip joints, knee joints, ankles, MTPs and PIPs with good range of motion without synovitis.  Bilateral first MTP, PIP and DIP thickening was noted.  CDAI Exam: CDAI Score: -- Patient Global: --; Provider Global: -- Swollen: --; Tender: -- Joint Exam 04/14/2021   No joint exam has been documented for this visit   There is currently no information documented on the homunculus. Go to the Rheumatology activity and complete the homunculus joint exam.  Investigation: No additional findings.  Imaging: No results found.  Recent Labs: Lab Results  Component Value Date   WBC 10.6 (H) 07/14/2020   HGB 13.1 07/14/2020   PLT 224 07/14/2020   NA 140 07/14/2020   K 4.3 07/14/2020   CL 103 07/14/2020   CO2 25 07/14/2020   GLUCOSE 101 (H) 07/14/2020   BUN 16 07/14/2020   CREATININE 1.14 (H) 07/14/2020   BILITOT 0.5 09/02/2020   ALKPHOS 66 09/02/2020   AST 22 09/02/2020   ALT 20 09/02/2020   PROT 7.0 09/02/2020   ALBUMIN 4.4 09/02/2020   CALCIUM 9.4 07/14/2020    Speciality Comments: No specialty comments available.  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  She was placed on hydroxychloroquine 200 mg, 2 tablets daily.  She has been taking it for the  last 5 years.  She states she noticed improvement in her symptoms of joint pain and stiffness since she has been on hydroxychloroquine.  There is no history of oral ulcers.  She gives history of nasal ulcers, sicca symptoms, arthralgias.  There is no history of malar rash, photosensitivity, Raynaud's phenomenon, sclerodactyly or lymphadenopathy.  There is no family  history of autoimmune disease.  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  No synovitis was noted.  Plan: Rheumatoid factor, Cyclic citrul peptide antibody, IgG  High risk medication use - PLQ 200 mg 2 tablets p.o. every morning- Plan: CBC with Differential/Platelet, COMPLETE METABOLIC PANEL WITH GFR today.  I discussed possibly decreasing the dose of hydroxychloroquine in the future.  I will repeat her hydroxychloroquine level at the next visit.  She was advised to hold her morning dose of hydroxychloroquine before the hydroxychloroquine level.  A handout was given and consent was taken today.  Pain in both hands -she complains of pain and discomfort in her bilateral hands.  No synovitis was noted.  DIP and PIP thickening was noted.  Plan: XR Hand 2 View Right, XR Hand 2 View Left.  X-ray showed bilateral CMC PIP and DIP narrowing consistent with osteoarthritis.  Pain in both feet -she complains of discomfort in her bilateral feet.  Clinical findings are consistent with osteoarthritis.  Plan: XR Foot 2 Views Right, XR Foot 2 Views Left.  Bilateral first MTP joint narrowing more severe on the right side, PIP and DIP narrowing  was noted.  These findings are consistent with osteoarthritis.  Chronic midline low back pain without sciatica -patient complains of pain and discomfort in her lower back.  Plan: XR Lumbar Spine 2-3 Views.  X-rays obtained today showed levoscoliosis, and facet joint arthropathy.  Myalgia -she gives history of episodic increased muscle pain in her arms and legs.  No muscular weakness was noted.  She was able to get up from the squatting position.  Plan: CK  Vitamin D deficiency-patient is currently not on vitamin D.  Essential hypertension-her blood pressure is mildly elevated.  She was advised to monitor blood pressure closely.  History of hyperlipidemia  History of gastroesophageal reflux (GERD)  History of Clostridioides difficile colitis - 05/2020  Goiter  Thyroid nodule  History of kidney stones   Orders: Orders Placed This Encounter  Procedures   XR Lumbar Spine 2-3 Views   XR Hand 2 View Right   XR Hand 2 View Left   XR Foot 2 Views Right   XR Foot 2 Views Left   CBC with Differential/Platelet   COMPLETE METABOLIC PANEL WITH GFR   Urinalysis, Routine w reflex microscopic   CK   Sedimentation rate   Rheumatoid factor   Cyclic citrul peptide antibody, IgG   ANA   Anti-scleroderma antibody   RNP Antibody   Anti-Smith antibody   Sjogrens syndrome-A extractable nuclear antibody   Sjogrens syndrome-B extractable nuclear antibody   Anti-DNA antibody, double-stranded   C3 and C4   Beta-2 glycoprotein antibodies   Cardiolipin antibodies, IgG, IgM, IgA   Lupus Anticoagulant Eval w/Reflex    No orders of the defined types were placed in this encounter.    Follow-Up Instructions: Return for Autoimmune disease.   Bo Merino, MD  Note - This record has been created using Editor, commissioning.  Chart creation errors have been sought, but may not always  have been located. Such creation errors do not reflect on  the standard of medical care.

## 2021-04-14 ENCOUNTER — Telehealth: Payer: Self-pay | Admitting: Pharmacist

## 2021-04-14 ENCOUNTER — Ambulatory Visit: Payer: Self-pay

## 2021-04-14 ENCOUNTER — Encounter: Payer: Self-pay | Admitting: Rheumatology

## 2021-04-14 ENCOUNTER — Other Ambulatory Visit: Payer: Self-pay

## 2021-04-14 ENCOUNTER — Ambulatory Visit (INDEPENDENT_AMBULATORY_CARE_PROVIDER_SITE_OTHER): Payer: Medicare Other | Admitting: Rheumatology

## 2021-04-14 VITALS — BP 149/74 | HR 81 | Ht 63.5 in | Wt 130.0 lb

## 2021-04-14 DIAGNOSIS — M064 Inflammatory polyarthropathy: Secondary | ICD-10-CM | POA: Diagnosis not present

## 2021-04-14 DIAGNOSIS — Z8719 Personal history of other diseases of the digestive system: Secondary | ICD-10-CM

## 2021-04-14 DIAGNOSIS — M359 Systemic involvement of connective tissue, unspecified: Secondary | ICD-10-CM

## 2021-04-14 DIAGNOSIS — M79642 Pain in left hand: Secondary | ICD-10-CM

## 2021-04-14 DIAGNOSIS — E559 Vitamin D deficiency, unspecified: Secondary | ICD-10-CM

## 2021-04-14 DIAGNOSIS — Z79899 Other long term (current) drug therapy: Secondary | ICD-10-CM | POA: Diagnosis not present

## 2021-04-14 DIAGNOSIS — M79671 Pain in right foot: Secondary | ICD-10-CM | POA: Diagnosis not present

## 2021-04-14 DIAGNOSIS — M79641 Pain in right hand: Secondary | ICD-10-CM

## 2021-04-14 DIAGNOSIS — M545 Low back pain, unspecified: Secondary | ICD-10-CM

## 2021-04-14 DIAGNOSIS — M79672 Pain in left foot: Secondary | ICD-10-CM

## 2021-04-14 DIAGNOSIS — M791 Myalgia, unspecified site: Secondary | ICD-10-CM

## 2021-04-14 DIAGNOSIS — G8929 Other chronic pain: Secondary | ICD-10-CM | POA: Diagnosis not present

## 2021-04-14 DIAGNOSIS — Z87442 Personal history of urinary calculi: Secondary | ICD-10-CM

## 2021-04-14 DIAGNOSIS — E049 Nontoxic goiter, unspecified: Secondary | ICD-10-CM

## 2021-04-14 DIAGNOSIS — I10 Essential (primary) hypertension: Secondary | ICD-10-CM

## 2021-04-14 DIAGNOSIS — Z8619 Personal history of other infectious and parasitic diseases: Secondary | ICD-10-CM

## 2021-04-14 DIAGNOSIS — E041 Nontoxic single thyroid nodule: Secondary | ICD-10-CM

## 2021-04-14 DIAGNOSIS — Z8639 Personal history of other endocrine, nutritional and metabolic disease: Secondary | ICD-10-CM

## 2021-04-14 NOTE — Patient Instructions (Signed)

## 2021-04-14 NOTE — Telephone Encounter (Signed)
Patient confirmed at [place] today, 04/14/21, that she receives yearly eye exams for hydroxychloroquine monitoring with [facility] in [place], [place] [phone]). She just completed eye exam approximately 2 weeks ago. [facility] to request eye exam be faxed to our clinic. Left VM with our clinic's fax and phone number.  Knox Saliva, PharmD, MPH, BCPS Clinical Pharmacist (Rheumatology and Pulmonology)

## 2021-04-14 NOTE — Progress Notes (Signed)
Pharmacy Note  Subjective: Patient presents today to [facility] for follow up office visit.   Patient seen by the pharmacist for counseling on hydroxychloroquine for CTD which she has taken x 5 years.  Objective: CMP     Component Value Date/Time   NA 140 07/14/2020 1247   K 4.3 07/14/2020 1247   CL 103 07/14/2020 1247   CO2 25 07/14/2020 1247   GLUCOSE 101 (H) 07/14/2020 1247   BUN 16 07/14/2020 1247   CREATININE 1.14 (H) 07/14/2020 1247   CALCIUM 9.4 07/14/2020 1247   PROT 7.0 09/02/2020 1210   ALBUMIN 4.4 09/02/2020 1210   AST 22 09/02/2020 1210   ALT 20 09/02/2020 1210   ALKPHOS 66 09/02/2020 1210   BILITOT 0.5 09/02/2020 1210   GFRNONAA 52 (L) 07/14/2020 1247   CBC    Component Value Date/Time   WBC 10.6 (H) 07/14/2020 1247   RBC 4.41 07/14/2020 1247   HGB 13.1 07/14/2020 1247   HCT 40.9 07/14/2020 1247   PLT 224 07/14/2020 1247   MCV 92.7 07/14/2020 1247   MCH 29.7 07/14/2020 1247   MCHC 32.0 07/14/2020 1247   RDW 13.9 07/14/2020 1247   LYMPHSABS 1.1 04/24/2020 0859   MONOABS 0.4 04/24/2020 0859   EOSABS 0.2 04/24/2020 0859   BASOSABS 0.0 04/24/2020 0859   Assessment/Plan: Patient was counseled on the purpose, proper use, and adverse effects of hydroxychloroquine including nausea/diarrhea, skin rash, headaches, and sun sensitivity.  Advised patient to wear sunscreen once starting hydroxychloroquine to reduce risk of rash associated with sun sensitivity.  Discussed importance of annual eye exams while on hydroxychloroquine to monitor to ocular toxicity and discussed importance of frequent laboratory monitoring.  Provided patient with eye exam form for baseline ophthalmologic exam.  Reviewed risk for QTC prolongation when used in combination with other QTc prolonging agents (including but not limited to antiarrhythmics, macrolide antibiotics, flouroquinolones, tricyclic antidepressants, citalopram, specific antipsychotics, ondansetron, migraine triptans,  and methadone). Provided patient with educational materials on hydroxychloroquine and answered all questions.  Patient consented to hydroxychloroquine. Will upload consent in the media tab.    Dose will be  hydroxychloroquine 400mg  once daily .  Prescription pending lab results.  Patient confirmed that she receives yearly eye exams for hydroxychloroquine monitoring with [facility] in [place], [place] [phone]). She just completed eye exam approximately 2 weeks ago. [place] to request eye exam.  Knox Saliva, PharmD, MPH, BCPS Clinical Pharmacist (Rheumatology and Pulmonology)

## 2021-04-18 LAB — URINALYSIS, ROUTINE W REFLEX MICROSCOPIC
Bilirubin Urine: NEGATIVE
Glucose, UA: NEGATIVE
Hgb urine dipstick: NEGATIVE
Ketones, ur: NEGATIVE
Leukocytes,Ua: NEGATIVE
Nitrite: NEGATIVE
Protein, ur: NEGATIVE
Specific Gravity, Urine: 1.004 (ref 1.001–1.035)
pH: 6.5 (ref 5.0–8.0)

## 2021-04-18 LAB — LUPUS ANTICOAGULANT EVAL W/ REFLEX
PTT-LA Screen: 39 s (ref <=40)
dRVVT: 31 s (ref <=45)

## 2021-04-18 LAB — CBC WITH DIFFERENTIAL/PLATELET
Absolute Monocytes: 369 cells/uL (ref 200–950)
Basophils Absolute: 41 cells/uL (ref 0–200)
Basophils Relative: 0.9 %
Eosinophils Absolute: 99 cells/uL (ref 15–500)
Eosinophils Relative: 2.2 %
HCT: 42.5 % (ref 35.0–45.0)
Hemoglobin: 13.7 g/dL (ref 11.7–15.5)
Lymphs Abs: 1220 cells/uL (ref 850–3900)
MCH: 29.5 pg (ref 27.0–33.0)
MCHC: 32.2 g/dL (ref 32.0–36.0)
MCV: 91.6 fL (ref 80.0–100.0)
MPV: 9.9 fL (ref 7.5–12.5)
Monocytes Relative: 8.2 %
Neutro Abs: 2772 cells/uL (ref 1500–7800)
Neutrophils Relative %: 61.6 %
Platelets: 226 10*3/uL (ref 140–400)
RBC: 4.64 10*6/uL (ref 3.80–5.10)
RDW: 12.8 % (ref 11.0–15.0)
Total Lymphocyte: 27.1 %
WBC: 4.5 10*3/uL (ref 3.8–10.8)

## 2021-04-18 LAB — COMPLETE METABOLIC PANEL WITH GFR
AG Ratio: 1.8 (calc) (ref 1.0–2.5)
ALT: 14 U/L (ref 6–29)
AST: 20 U/L (ref 10–35)
Albumin: 4.6 g/dL (ref 3.6–5.1)
Alkaline phosphatase (APISO): 68 U/L (ref 37–153)
BUN: 16 mg/dL (ref 7–25)
CO2: 30 mmol/L (ref 20–32)
Calcium: 10.3 mg/dL (ref 8.6–10.4)
Chloride: 104 mmol/L (ref 98–110)
Creat: 0.86 mg/dL (ref 0.60–1.00)
Globulin: 2.6 g/dL (calc) (ref 1.9–3.7)
Glucose, Bld: 82 mg/dL (ref 65–99)
Potassium: 4.2 mmol/L (ref 3.5–5.3)
Sodium: 141 mmol/L (ref 135–146)
Total Bilirubin: 0.4 mg/dL (ref 0.2–1.2)
Total Protein: 7.2 g/dL (ref 6.1–8.1)
eGFR: 73 mL/min/{1.73_m2} (ref >=60)

## 2021-04-18 LAB — RNP ANTIBODY: Ribonucleic Protein(ENA) Antibody, IgG: <1 AI

## 2021-04-18 LAB — BETA-2 GLYCOPROTEIN ANTIBODIES
Beta-2 Glyco 1 IgA: <2 U/mL
Beta-2 Glyco 1 IgM: <2 U/mL
Beta-2 Glyco I IgG: <2 U/mL

## 2021-04-18 LAB — CARDIOLIPIN ANTIBODIES, IGG, IGM, IGA
Anticardiolipin IgA: <2 APL-U/mL
Anticardiolipin IgG: <2 GPL-U/mL
Anticardiolipin IgM: <2 MPL-U/mL

## 2021-04-18 LAB — ANTI-DNA ANTIBODY, DOUBLE-STRANDED: ds DNA Ab: <1 IU/mL

## 2021-04-18 LAB — RHEUMATOID FACTOR: Rheumatoid fact SerPl-aCnc: <14 IU/mL (ref <14)

## 2021-04-18 LAB — ANTI-SCLERODERMA ANTIBODY: Scleroderma (Scl-70) (ENA) Antibody, IgG: <1 AI

## 2021-04-18 LAB — SJOGRENS SYNDROME-A EXTRACTABLE NUCLEAR ANTIBODY: SSA (Ro) (ENA) Antibody, IgG: <1 AI

## 2021-04-18 LAB — ANTI-NUCLEAR AB-TITER (ANA TITER): ANA Titer 1: 1:80 {titer} — ABNORMAL HIGH

## 2021-04-18 LAB — CYCLIC CITRUL PEPTIDE ANTIBODY, IGG: Cyclic Citrullin Peptide Ab: <16 UNITS

## 2021-04-18 LAB — SJOGRENS SYNDROME-B EXTRACTABLE NUCLEAR ANTIBODY: SSB (La) (ENA) Antibody, IgG: <1 AI

## 2021-04-18 LAB — C3 AND C4
C3 Complement: 126 mg/dL (ref 83–193)
C4 Complement: 16 mg/dL (ref 15–57)

## 2021-04-18 LAB — SEDIMENTATION RATE: Sed Rate: 2 mm/h (ref 0–30)

## 2021-04-18 LAB — ANA: Anti Nuclear Antibody (ANA): POSITIVE — AB

## 2021-04-18 LAB — ANTI-SMITH ANTIBODY: ENA SM Ab Ser-aCnc: <1 AI

## 2021-04-18 LAB — CK: Total CK: 71 U/L (ref 29–143)

## 2021-04-18 NOTE — Progress Notes (Signed)
All the labs are within normal limits except for positive ANA.  I will discuss results at the follow-up visit.

## 2021-05-05 NOTE — Progress Notes (Signed)
Office Visit Note  Patient: Carol Scott             Date of Birth: 1950-09-13           MRN: [id number]             PCP: Clinton Quant, MD Referring: Clinton Quant, MD Visit Date: 05/14/2021 Occupation: _0 @  Subjective:  Medication monitoring.   History of Present Illness: Carol Scott is a 70 y.o. female with a history of autoimmune disease diagnosed by Dr. Cristal Deer several years ago.  She also has osteoarthritis.  She returns for the follow-up visit.  She states she continues to have dry mouth and dry eyes symptoms and occasional nasal sores.  She has not had increased her joint pain, joint swelling or muscle pain.  Activities of Daily Living:  Patient reports morning stiffness for 20-30 minutes.   Patient Denies nocturnal pain.  Difficulty dressing/grooming: Denies Difficulty climbing stairs: Denies Difficulty getting out of chair: Denies Difficulty using hands for taps, buttons, cutlery, and/or writing: Denies  Review of Systems  Constitutional:  Positive for fatigue.  HENT:  Positive for mouth dryness and nose dryness. Negative for mouth sores.        Nose sores  Eyes:  Positive for itching and dryness. Negative for pain.  Respiratory:  Negative for shortness of breath and difficulty breathing.   Cardiovascular:  Negative for chest pain and palpitations.  Gastrointestinal:  Negative for blood in stool, constipation and diarrhea.  Endocrine: Negative for increased urination.  Genitourinary:  Negative for difficulty urinating.  Musculoskeletal:  Positive for joint pain, joint pain, myalgias, morning stiffness, muscle tenderness and myalgias. Negative for joint swelling.  Skin:  Negative for color change, rash and redness.  Allergic/Immunologic: Negative for susceptible to infections.  Neurological:  Negative for dizziness, numbness, headaches, memory loss and weakness.  Hematological:  Positive for bruising/bleeding tendency.  Psychiatric/Behavioral:   Negative for confusion.    PMFS History:  Patient Active Problem List   Diagnosis Date Noted   Autoimmune disease ([place]) 05/14/2021   High risk medication use 05/14/2021   Primary osteoarthritis of both hands 05/14/2021   Primary osteoarthritis of both feet 05/14/2021   Arthropathy of lumbar facet joint 05/14/2021   Vitamin D deficiency 05/14/2021   Essential hypertension 05/14/2021   History of hyperlipidemia 05/14/2021   History of gastroesophageal reflux (GERD) 05/14/2021   History of kidney stones 05/14/2021   Diarrhea 07/14/2020   Infectious colitis 07/11/2020   Common bile duct dilation 07/11/2020   Nausea without vomiting 01/01/2015   Abdominal pain, epigastric 01/01/2015    Past Medical History:  Diagnosis Date   Anal fissure    Anxiety    Cervical spondylosis without myelopathy    Connective tissue disease ([place])    rheumotology-- dr Ena Dawley ([place], [place])--- autoimmune , takes plaquenil   DDD (degenerative disc disease), cervical    GERD (gastroesophageal reflux disease)    Hemorrhoids    History of concussion    04-22-2020 per pt hit head no loc 05/ 2021, residual balance issue and headaches (headaches resolved but still have balance issues)   History of COVID-19    History of palpitations all test results in care everywhere  (04-22-2020  per pt all symptoms resolved)   per pt sent to cardiologist, dr Clayborn Bigness, 07/ 2021 (note in epic)  for palpatitions , poss. angia at rest, sob & r/o afib;  had 72 hour monitor (per note 04-07-2020 SR -- ST biphasic p waves  no evidence afib/ flutter),  echo 03-28-2020 normal w/ ef >55%, and nuclear stress test 03-04-2020 borderline abnormal notable for poor uptake during rest , normal perfusion, and no evidence ischemia, ef 66%    Hypertension    followed by pcp   Multiple thyroid nodules    04-22-2020  per pt has had previous bx's which were benign , followed by pcp   OA (osteoarthritis)    shoudlers, knees   Perianal pain    PONV  (postoperative nausea and vomiting)    Wears glasses     Family History  Problem Relation Age of Onset   Breast cancer Mother    Diabetes Mother    Prostate cancer Father    Diabetes Father    Heart disease Father    Breast cancer Sister    Dementia Sister    Prostate cancer Brother    Breast cancer Maternal Aunt    Healthy Son    Healthy Son    Healthy Daughter    Colon cancer Neg Hx    Esophageal cancer Neg Hx    Rectal cancer Neg Hx    Stomach cancer Neg Hx    Past Surgical History:  Procedure Laterality Date   BOTOX INJECTION N/A 04/24/2020   Procedure: POSSIBLE BOTOX INJECTION;  Surgeon: Ileana Roup, MD;  Location: [facility];  Service: General;  Laterality: N/A;   COLONOSCOPY WITH PROPOFOL  01-04-2018  dr Henrene Pastor   ESOPHAGOGASTRODUODENOSCOPY (EGD) WITH PROPOFOL N/A 10/09/2020   Procedure: ESOPHAGOGASTRODUODENOSCOPY (EGD) WITH PROPOFOL;  Surgeon: Milus Banister, MD;  Location: [facility];  Service: Endoscopy;  Laterality: N/A;   EUS N/A 10/09/2020   Procedure: UPPER ENDOSCOPIC ULTRASOUND (EUS) RADIAL;  Surgeon: Milus Banister, MD;  Location: [facility];  Service: Endoscopy;  Laterality: N/A;   HEMORRHOID SURGERY  2005   RECTAL EXAM UNDER ANESTHESIA N/A 04/24/2020   Procedure: ANORECTAL EXAM UNDER ANESTHESIA, EXCISION OF PERIANAL LESION; BOTOX INJECTION;  Surgeon: Ileana Roup, MD;  Location: [facility];  Service: General;  Laterality: N/A;   SUPRACERVICAL ABDOMINAL HYSTERECTOMY  2006   WITH BILATERAL SALPINGOOPHORECTOMY AND PELVIC FLOOR REPAIR (possible inculding sling)   TONSILLECTOMY AND ADENOIDECTOMY  1960   UPPER GASTROINTESTINAL ENDOSCOPY  01/06/2015   Social History   Social History Narrative   Not on file   Immunization History  Administered Date(s) Administered   Moderna Sars-Covid-2 Vaccination 08/17/2019, 09/14/2019, 05/16/2020     Objective: Vital Signs: BP 132/76 (BP Location: Left Arm, Patient  Position: Sitting, Cuff Size: Normal)   Pulse 88   Ht _0  (1.626 m)   Wt 129 lb 6.4 oz (58.7 kg)   BMI 22.21 kg/m    Physical Exam Vitals and nursing note reviewed.  Constitutional:      Appearance: She is well-developed.  HENT:     Head: Normocephalic and atraumatic.  Eyes:     Conjunctiva/sclera: Conjunctivae normal.  Cardiovascular:     Rate and Rhythm: Normal rate and regular rhythm.     Heart sounds: Normal heart sounds.  Pulmonary:     Effort: Pulmonary effort is normal.     Breath sounds: Normal breath sounds.  Abdominal:     General: Bowel sounds are normal.     Palpations: Abdomen is soft.  Musculoskeletal:     Cervical back: Normal range of motion.  Lymphadenopathy:     Cervical: No cervical adenopathy.  Skin:    General: Skin is warm and dry.  Capillary Refill: Capillary refill takes less than 2 seconds.  Neurological:     Mental Status: She is alert and oriented to person, place, and time.  Psychiatric:        Behavior: Behavior normal.     Musculoskeletal Exam: C-spine was in good range of motion.  Shoulder joints, elbow joints, wrist joints, MCPs PIPs and DIPs with good range of motion with no synovitis.  She had bilateral PIP and DIP thickening.  Hip joints, knee joints, ankles, MTPs and PIPs with good range of motion with no synovitis.  CDAI Exam: CDAI Score: -- Patient Global: --; Provider Global: -- Swollen: --; Tender: -- Joint Exam 05/14/2021   No joint exam has been documented for this visit   There is currently no information documented on the homunculus. Go to the Rheumatology activity and complete the homunculus joint exam.  Investigation: No additional findings.  Imaging: No results found.  Recent Labs: Lab Results  Component Value Date   WBC 4.5 04/14/2021   HGB 13.7 04/14/2021   PLT 226 04/14/2021   NA 141 04/14/2021   K 4.2 04/14/2021   CL 104 04/14/2021   CO2 30 04/14/2021   GLUCOSE 82 04/14/2021   BUN 16 04/14/2021    CREATININE 0.86 04/14/2021   BILITOT 0.4 04/14/2021   ALKPHOS 66 09/02/2020   AST 20 04/14/2021   ALT 14 04/14/2021   PROT 7.2 04/14/2021   ALBUMIN 4.4 09/02/2020   CALCIUM 10.3 04/14/2021   April 14, 2021 UA negative, CK 71, ESR 2, RF negative, anti-CCP negative, ANA 1: 80NH, ENA negative, anticardiolipin negative, beta-2 GP 1 negative, lupus anticoagulant negative  Speciality Comments: Dxd by Dr. Cristal Deer Ophthalmologist-[facility] [place] 2022 per pt.  Procedures:  No procedures performed Allergies: Contrast media [iodinated diagnostic agents], Iodine, Lisinopril, Macrobid [nitrofurantoin], Metrizamide, Moxifloxacin, and Prednisone   Assessment / Plan:     Visit Diagnoses: Autoimmune disease ([place]) - dxd by Dr. Cristal Deer several years ago.  History of +ANA,nasal ulcers,sicca symptoms, and arthritis.  Patient states she initially presented with pain and fatigue which resolved after starting on hydroxychloroquine.  She noticed improvement in joint discomfort on Plaquenil.  I had a detailed discussion regarding her lab results with the patient.  Besides positive ANA all her autoimmune work-up was negative.  She continues to have sicca symptoms, and nasal ulcers.  She has some joint discomfort due to underlying osteoarthritis but no joint inflammation.  She has been taking hydroxychloroquine 200 mg p.o. twice daily daily.  I will check hydroxychloroquine level today.  I also advised her to reduce the dose of hydroxychloroquine to 200 mg p.o. twice daily Monday to Friday only.  High risk medication use - She is on Plaquenil 200 mg, 2 tablets p.o. daily for 5 years.  Patient states she had eye examination this year at [facility] in [place].  She will forward the eye examination report to [place].  Once we get the report we can refill her hydroxychloroquine.  Will obtain hydroxychloroquine level today.  Her last dose of hydroxychloroquine was yesterday.  Primary osteoarthritis of both  hands - No synovitis was noted.  Clinical and radiographic findings are consistent with osteoarthritis.  X-ray findings were discussed with the patient.  Joint protection muscle strengthening was discussed.  A handout of exercises was given.  Primary osteoarthritis of both feet - No synovitis was noted.  Clinical and radiographic findings were consistent with osteoarthritis.  Severe arthritis of right first MTP was noted.  X-ray findings were discussed.  Proper fitting shoes were discussed.  Arthropathy of lumbar facet joint - History of chronic lower back pain.  X-rays were consistent with levoscoliosis and facet joint arthropathy.  She has some lower back discomfort.  Core strengthening exercises were discussed.  A handout on exercises was given.  Myalgia - History of chronic lower back pain.  No muscular weakness was noted.  CK is normal.  Vitamin D deficiency-she has been on vitamin D supplement.  Essential hypertension-her blood pressure is normal.  History of hyperlipidemia-increased risk of heart disease with autoimmune disease was discussed.  Dietary modifications and exercise was emphasized.  She is planning to join a gym.  History of gastroesophageal reflux (GERD)  History of Clostridioides difficile colitis - December 2021  Thyroid nodule  History of kidney stones-according to the patient it was an incidental finding.  She never had symptoms from kidney stones.  Orders: Orders Placed This Encounter  Procedures   Hydroxychloroquine, Blood    No orders of the defined types were placed in this encounter.   Follow-Up Instructions: Return in about 4 months (around 09/12/2021) for Autoimmune disease, Osteoarthritis.   Bo Merino, MD  Note - This record has been created using Editor, commissioning.  Chart creation errors have been sought, but may not always  have been located. Such creation errors do not reflect on  the standard of medical care.

## 2021-05-14 ENCOUNTER — Ambulatory Visit (INDEPENDENT_AMBULATORY_CARE_PROVIDER_SITE_OTHER): Payer: Medicare Other | Admitting: Rheumatology

## 2021-05-14 ENCOUNTER — Encounter: Payer: Self-pay | Admitting: Rheumatology

## 2021-05-14 ENCOUNTER — Other Ambulatory Visit: Payer: Self-pay

## 2021-05-14 VITALS — BP 132/76 | HR 88 | Ht 64.0 in | Wt 129.4 lb

## 2021-05-14 DIAGNOSIS — M19072 Primary osteoarthritis, left ankle and foot: Secondary | ICD-10-CM

## 2021-05-14 DIAGNOSIS — M19071 Primary osteoarthritis, right ankle and foot: Secondary | ICD-10-CM | POA: Diagnosis not present

## 2021-05-14 DIAGNOSIS — M359 Systemic involvement of connective tissue, unspecified: Secondary | ICD-10-CM

## 2021-05-14 DIAGNOSIS — Z8639 Personal history of other endocrine, nutritional and metabolic disease: Secondary | ICD-10-CM

## 2021-05-14 DIAGNOSIS — M19042 Primary osteoarthritis, left hand: Secondary | ICD-10-CM

## 2021-05-14 DIAGNOSIS — M19041 Primary osteoarthritis, right hand: Secondary | ICD-10-CM | POA: Diagnosis not present

## 2021-05-14 DIAGNOSIS — I10 Essential (primary) hypertension: Secondary | ICD-10-CM

## 2021-05-14 DIAGNOSIS — M47816 Spondylosis without myelopathy or radiculopathy, lumbar region: Secondary | ICD-10-CM | POA: Insufficient documentation

## 2021-05-14 DIAGNOSIS — Z87442 Personal history of urinary calculi: Secondary | ICD-10-CM | POA: Insufficient documentation

## 2021-05-14 DIAGNOSIS — E041 Nontoxic single thyroid nodule: Secondary | ICD-10-CM

## 2021-05-14 DIAGNOSIS — E559 Vitamin D deficiency, unspecified: Secondary | ICD-10-CM

## 2021-05-14 DIAGNOSIS — Z8619 Personal history of other infectious and parasitic diseases: Secondary | ICD-10-CM

## 2021-05-14 DIAGNOSIS — Z8719 Personal history of other diseases of the digestive system: Secondary | ICD-10-CM

## 2021-05-14 DIAGNOSIS — Z79899 Other long term (current) drug therapy: Secondary | ICD-10-CM | POA: Insufficient documentation

## 2021-05-14 DIAGNOSIS — M791 Myalgia, unspecified site: Secondary | ICD-10-CM

## 2021-05-14 NOTE — Patient Instructions (Addendum)
Please reduce hydroxychloroquine to 200 mg p.o. twice daily Monday to Friday (do not take it on Saturday and Sunday)  Please mail or fax your eye examination report   Standing Labs We placed an order today for your standing lab work.   Please have your standing labs drawn in April  If possible, please have your labs drawn 2 weeks prior to your appointment so that the provider can discuss your results at your appointment.  Please note that you may see your imaging and lab results in [place] before we have reviewed them. We may be awaiting multiple results to interpret others before contacting you. Please allow our office up to 72 hours to thoroughly review all of the results before contacting the office for clarification of your results.  We have open lab daily: Monday through Thursday from 1:30-4:30 PM and Friday from 1:30-4:00 PM at the office of Dr. Bo Merino, [facility].   Please be advised, all patients with office appointments requiring lab work will take precedent over walk-in lab work.  If possible, please come for your lab work on Monday and Friday afternoons, as you may experience shorter wait times. The office is located at [address], [place], [place], [place] [address] No appointment is necessary.   Labs are drawn by [facility]. Please bring your co-pay at the time of your lab draw.  You may receive a bill from [facility] for your lab work.  If you wish to have your labs drawn at another location, please call the office 24 hours in advance to send orders.  If you have any questions regarding directions or hours of operation,  please call [phone].   As a reminder, please drink plenty of water prior to coming for your lab work. Thanks!   Vaccines You are taking a medication(s) that can suppress your immune system.  The following immunizations are recommended: Flu annually Covid-19  Td/Tdap (tetanus, diphtheria, pertussis) every 10 years Pneumonia  (Prevnar 15 then Pneumovax 23 at least 1 year apart.  Alternatively, can take Prevnar 20 without needing additional dose) Shingrix: 2 doses from 4 weeks to 6 months apart  Please check with your PCP to make sure you are up to date.   Back Exercises The following exercises strengthen the muscles that help to support the trunk (torso) and back. They also help to keep the lower back flexible. Doing these exercises can help to prevent or lessen existing low back pain. If you have back pain or discomfort, try doing these exercises 2-3 times each day or as told by your health care provider. As your pain improves, do them once each day, but increase the number of times that you repeat the steps for each exercise (do more repetitions). To prevent the recurrence of back pain, continue to do these exercises once each day or as told by your health care provider. Do exercises exactly as told by your health care provider and adjust them as directed. It is normal to feel mild stretching, pulling, tightness, or discomfort as you do these exercises, but you should stop right away if you feel sudden pain or your pain gets worse. Exercises Single knee to chest Repeat these steps 3-5 times for each leg: Lie on your back on a firm bed or the floor with your legs extended. Bring one knee to your chest. Your other leg should stay extended and in contact with the floor. Hold your knee in place by grabbing your knee or thigh with  both hands and hold. Pull on your knee until you feel a gentle stretch in your lower back or buttocks. Hold the stretch for 10-30 seconds. Slowly release and straighten your leg.  Pelvic tilt Repeat these steps 5-10 times: Lie on your back on a firm bed or the floor with your legs extended. Bend your knees so they are pointing toward the ceiling and your feet are flat on the floor. Tighten your lower abdominal muscles to press your lower back against the floor. This motion will tilt your  pelvis so your tailbone points up toward the ceiling instead of pointing to your feet or the floor. With gentle tension and even breathing, hold this position for 5-10 seconds.  Cat-cow Repeat these steps until your lower back becomes more flexible: Get into a hands-and-knees position on a firm bed or the floor. Keep your hands under your shoulders, and keep your knees under your hips. You may place padding under your knees for comfort. Let your head hang down toward your chest. Contract your abdominal muscles and point your tailbone toward the floor so your lower back becomes rounded like the back of a cat. Hold this position for 5 seconds. Slowly lift your head, let your abdominal muscles relax, and point your tailbone up toward the ceiling so your back forms a sagging arch like the back of a cow. Hold this position for 5 seconds.  Press-ups Repeat these steps 5-10 times: Lie on your abdomen (face-down) on a firm bed or the floor. Place your palms near your head, about shoulder-width apart. Keeping your back as relaxed as possible and keeping your hips on the floor, slowly straighten your arms to raise the top half of your body and lift your shoulders. Do not use your back muscles to raise your upper torso. You may adjust the placement of your hands to make yourself more comfortable. Hold this position for 5 seconds while you keep your back relaxed. Slowly return to lying flat on the floor.  Bridges Repeat these steps 10 times: Lie on your back on a firm bed or the floor. Bend your knees so they are pointing toward the ceiling and your feet are flat on the floor. Your arms should be flat at your sides, next to your body. Tighten your buttocks muscles and lift your buttocks off the floor until your waist is at almost the same height as your knees. You should feel the muscles working in your buttocks and the back of your thighs. If you do not feel these muscles, slide your feet 1-2 inches  (2.5-5 cm) farther away from your buttocks. Hold this position for 3-5 seconds. Slowly lower your hips to the starting position, and allow your buttocks muscles to relax completely. If this exercise is too easy, try doing it with your arms crossed over your chest. Abdominal crunches Repeat these steps 5-10 times: Lie on your back on a firm bed or the floor with your legs extended. Bend your knees so they are pointing toward the ceiling and your feet are flat on the floor. Cross your arms over your chest. Tip your chin slightly toward your chest without bending your neck. Tighten your abdominal muscles and slowly raise your torso high enough to lift your shoulder blades a tiny bit off the floor. Avoid raising your torso higher than that because it can put too much stress on your lower back and does not help to strengthen your abdominal muscles. Slowly return to your starting position.  Back lifts Repeat these steps 5-10 times: Lie on your abdomen (face-down) with your arms at your sides, and rest your forehead on the floor. Tighten the muscles in your legs and your buttocks. Slowly lift your chest off the floor while you keep your hips pressed to the floor. Keep the back of your head in line with the curve in your back. Your eyes should be looking at the floor. Hold this position for 3-5 seconds. Slowly return to your starting position.  Contact a health care provider if: Your back pain or discomfort gets much worse when you do an exercise. Your worsening back pain or discomfort does not lessen within 2 hours after you exercise. If you have any of these problems, stop doing these exercises right away. Do not do them again unless your health care provider says that you can. Get help right away if: You develop sudden, severe back pain. If this happens, stop doing the exercises right away. Do not do them again unless your health care provider says that you can. This information is not intended  to replace advice given to you by your health care provider. Make sure you discuss any questions you have with your health care provider. Document Revised: 11/25/2020 Document Reviewed: 08/13/2020 Elsevier Patient Education  [place] Exercises Hand exercises can be helpful for almost anyone. These exercises can strengthen the hands, improve flexibility and movement, and increase blood flow to the hands. These results can make work and daily tasks easier. Hand exercises can be especially helpful for people who have joint pain from arthritis or have nerve damage from overuse (carpal tunnel syndrome). These exercises can also help people who have injured a hand. Exercises Most of these hand exercises are gentle stretching and motion exercises. It is usually safe to do them often throughout the day. Warming up your hands before exercise may help to reduce stiffness. You can do this with gentle massage or by placing your hands in warm water for 10-15 minutes. It is normal to feel some stretching, pulling, tightness, or mild discomfort as you begin new exercises. This will gradually improve. Stop an exercise right away if you feel sudden, severe pain or your pain gets worse. Ask your health care provider which exercises are best for you. Knuckle bend or "claw" fist  Stand or sit with your arm, hand, and all five fingers pointed straight up. Make sure to keep your wrist straight during the exercise. Gently bend your fingers down toward your palm until the tips of your fingers are touching the top of your palm. Keep your big knuckle straight and just bend the small knuckles in your fingers. Hold this position for __________ seconds. Straighten (extend) your fingers back to the starting position. Repeat this exercise 5-10 times with each hand. Full finger fist  Stand or sit with your arm, hand, and all five fingers pointed straight up. Make sure to keep your wrist straight during the  exercise. Gently bend your fingers into your palm until the tips of your fingers are touching the middle of your palm. Hold this position for __________ seconds. Extend your fingers back to the starting position, stretching every joint fully. Repeat this exercise 5-10 times with each hand. Straight fist Stand or sit with your arm, hand, and all five fingers pointed straight up. Make sure to keep your wrist straight during the exercise. Gently bend your fingers at the big knuckle, where your fingers meet your hand, and the middle knuckle.  Keep the knuckle at the tips of your fingers straight and try to touch the bottom of your palm. Hold this position for __________ seconds. Extend your fingers back to the starting position, stretching every joint fully. Repeat this exercise 5-10 times with each hand. Tabletop  Stand or sit with your arm, hand, and all five fingers pointed straight up. Make sure to keep your wrist straight during the exercise. Gently bend your fingers at the big knuckle, where your fingers meet your hand, as far down as you can while keeping the small knuckles in your fingers straight. Think of forming a tabletop with your fingers. Hold this position for __________ seconds. Extend your fingers back to the starting position, stretching every joint fully. Repeat this exercise 5-10 times with each hand. Finger spread  Place your hand flat on a table with your palm facing down. Make sure your wrist stays straight as you do this exercise. Spread your fingers and thumb apart from each other as far as you can until you feel a gentle stretch. Hold this position for __________ seconds. Bring your fingers and thumb tight together again. Hold this position for __________ seconds. Repeat this exercise 5-10 times with each hand. Making circles  Stand or sit with your arm, hand, and all five fingers pointed straight up. Make sure to keep your wrist straight during the exercise. Make a  circle by touching the tip of your thumb to the tip of your index finger. Hold for __________ seconds. Then open your hand wide. Repeat this motion with your thumb and each finger on your hand. Repeat this exercise 5-10 times with each hand. Thumb motion  Sit with your forearm resting on a table and your wrist straight. Your thumb should be facing up toward the ceiling. Keep your fingers relaxed as you move your thumb. Lift your thumb up as high as you can toward the ceiling. Hold for __________ seconds. Bend your thumb across your palm as far as you can, reaching the tip of your thumb for the small finger (pinkie) side of your palm. Hold for __________ seconds. Repeat this exercise 5-10 times with each hand. Grip strengthening  Hold a stress ball or other soft ball in the middle of your hand. Slowly increase the pressure, squeezing the ball as much as you can without causing pain. Think of bringing the tips of your fingers into the middle of your palm. All of your finger joints should bend when doing this exercise. Hold your squeeze for __________ seconds, then relax. Repeat this exercise 5-10 times with each hand. Contact a health care provider if: Your hand pain or discomfort gets much worse when you do an exercise. Your hand pain or discomfort does not improve within 2 hours after you exercise. If you have any of these problems, stop doing these exercises right away. Do not do them again unless your health care provider says that you can. Get help right away if: You develop sudden, severe hand pain or swelling. If this happens, stop doing these exercises right away. Do not do them again unless your health care provider says that you can. This information is not intended to replace advice given to you by your health care provider. Make sure you discuss any questions you have with your health care provider. Document Revised: 09/18/2020 Document Reviewed: 09/18/2020 Elsevier Patient  Education  [place].

## 2021-05-21 ENCOUNTER — Encounter: Payer: Self-pay | Admitting: Rheumatology

## 2021-05-21 DIAGNOSIS — M359 Systemic involvement of connective tissue, unspecified: Secondary | ICD-10-CM

## 2021-05-21 DIAGNOSIS — Z79899 Other long term (current) drug therapy: Secondary | ICD-10-CM

## 2021-05-21 LAB — HYDROXYCHLOROQUINE,BLOOD: HYDROXYCHLOROQUINE, (B): 1600 ng/mL — ABNORMAL HIGH

## 2021-05-21 MED ORDER — HYDROXYCHLOROQUINE SULFATE 200 MG PO TABS: 200.0000 mg | ORAL_TABLET | Freq: Every day | ORAL | 0 refills | Status: DC

## 2021-05-21 NOTE — Telephone Encounter (Signed)
-----   Message from Bo Merino, MD sent at 05/21/2021  1:10 PM EST ----- Hydroxychloroquine level is in the toxic range.  She should reduce hydroxychloroquine to 1 tablet daily.  We will recheck hydroxychloroquine level in 3 months.  The level should be checked at least 4 hours after the last dose of hydroxychloroquine

## 2021-05-21 NOTE — Progress Notes (Signed)
Hydroxychloroquine level is in the toxic range.  She should reduce hydroxychloroquine to 1 tablet daily.  We will recheck hydroxychloroquine level in 3 months.  The level should be checked at least 4 hours after the last dose of hydroxychloroquine

## 2021-05-21 NOTE — Telephone Encounter (Signed)
Please notify patient that the blood level for the Plaquenil is above the normal limits.

## 2021-08-28 NOTE — Progress Notes (Deleted)
? ?Office Visit Note ? ?Patient: Carol Scott             ?Date of Birth: 07/22/1950           ?MRN: [id number]             ?PCP: Pomposini, Cherly Anderson, MD ?Referring: Clinton Quant, MD ?Visit Date: 09/11/2021 ?Occupation: '@GUAROCC'$ @ ? ?Subjective:  ?No chief complaint on file. ? ? ?History of Present Illness: Carol Scott is a 71 y.o. female ***  ? ?Activities of Daily Living:  ?Patient reports morning stiffness for *** {minute/hour:19697}.   ?Patient {ACTIONS;DENIES/REPORTS:21021675::"Denies"} nocturnal pain.  ?Difficulty dressing/grooming: {ACTIONS;DENIES/REPORTS:21021675::"Denies"} ?Difficulty climbing stairs: {ACTIONS;DENIES/REPORTS:21021675::"Denies"} ?Difficulty getting out of chair: {ACTIONS;DENIES/REPORTS:21021675::"Denies"} ?Difficulty using hands for taps, buttons, cutlery, and/or writing: {ACTIONS;DENIES/REPORTS:21021675::"Denies"} ? ?No Rheumatology ROS completed.  ? ?PMFS History:  ?Patient Active Problem List  ? Diagnosis Date Noted  ?? Autoimmune disease ([place]) 05/14/2021  ?? High risk medication use 05/14/2021  ?? Primary osteoarthritis of both hands 05/14/2021  ?? Primary osteoarthritis of both feet 05/14/2021  ?? Arthropathy of lumbar facet joint 05/14/2021  ?? Vitamin D deficiency 05/14/2021  ?? Essential hypertension 05/14/2021  ?? History of hyperlipidemia 05/14/2021  ?? History of gastroesophageal reflux (GERD) 05/14/2021  ?? History of kidney stones 05/14/2021  ?? Diarrhea 07/14/2020  ?? Infectious colitis 07/11/2020  ?? Common bile duct dilation 07/11/2020  ?? Nausea without vomiting 01/01/2015  ?? Abdominal pain, epigastric 01/01/2015  ?  ?Past Medical History:  ?Diagnosis Date  ?? Anal fissure   ?? Anxiety   ?? Cervical spondylosis without myelopathy   ?? Connective tissue disease ([place])   ? rheumotology-- dr Ena Daw[place] ([place]--- autoimmune , takes plaquenil  ?? DDD (degenerative disc disease), cervical   ?? GERD (gastroesophageal reflux disease)   ?? Hemorrhoids   ?? History of  concussion   ? 04-22-2020 per pt hit head no loc 05/ 2021, residual balance issue and headaches (headaches resolved but still have balance issues)  ?? History of COVID-19   ?? History of palpitations all test results in care everywhere  (04-22-2020  per pt all symptoms resolved)  ? per pt sent to cardiologist, dr Clayborn Bigness, 07/ 2021 (note in epic)  for palpatitions , poss. angia at rest, sob & r/o afib;  had 72 hour monitor (per note 04-07-2020 SR -- ST biphasic p waves no evidence afib/ flutter),  echo 03-28-2020 normal w/ ef >55%, and nuclear stress test 03-04-2020 borderline abnormal notable for poor uptake during rest , normal perfusion, and no evidence ischemia, ef 66%   ?? Hypertension   ? followed by pcp  ?? Multiple thyroid nodules   ? 04-22-2020  per pt has had previous bx's which were benign , followed by pcp  ?? OA (osteoarthritis)   ? shoudlers, knees  ?? Perianal pain   ?? PONV (postoperative nausea and vomiting)   ?? Wears glasses   ?  ?Family History  ?Problem Relation Age of Onset  ?? Breast cancer Mother   ?? Diabetes Mother   ?? Prostate cancer Father   ?? Diabetes Father   ?? Heart disease Father   ?? Breast cancer Sister   ?? Dementia Sister   ?? Prostate cancer Brother   ?? Breast cancer Maternal Aunt   ?? Healthy Son   ?? Healthy Son   ?? Healthy Daughter   ?? Colon cancer Neg Hx   ?? Esophageal cancer Neg Hx   ?? Rectal cancer Neg Hx   ?? Stomach cancer Neg Hx   ? ?  Past Surgical History:  ?Procedure Laterality Date  ?? BOTOX INJECTION N/A 04/24/2020  ? Procedure: POSSIBLE BOTOX INJECTION;  Surgeon: Ileana Roup, MD;  [facility]  Service: General;  Laterality: N/A;  ?? COLONOSCOPY WITH PROPOFOL  01-04-2018  dr Henrene Pastor  ?? ESOPHAGOGASTRODUODENOSCOPY (EGD) WITH PROPOFOL N/A 10/09/2020  ? Procedure: ESOPHAGOGASTRODUODENOSCOPY (EGD) WITH PROPOFOL;  Surgeon: Milus Banister, MD;  Location: [facility];  Service: Endoscopy;  Laterality: N/A;  ?? EUS N/A 10/09/2020  ?  Procedure: UPPER ENDOSCOPIC ULTRASOUND (EUS) RADIAL;  Surgeon: Milus Banister, MD;  Location: [facility];  Service: Endoscopy;  Laterality: N/A;  ?? HEMORRHOID SURGERY  2005  ?? RECTAL EXAM UNDER ANESTHESIA N/A 04/24/2020  ? Procedure: ANORECTAL EXAM UNDER ANESTHESIA, EXCISION OF PERIANAL LESION; BOTOX INJECTION;  Surgeon: Ileana Roup, MD;  Location: [facility];  Service: General;  Laterality: N/A;  ?? SUPRACERVICAL ABDOMINAL HYSTERECTOMY  2006  ? WITH BILATERAL SALPINGOOPHORECTOMY AND PELVIC FLOOR REPAIR (possible inculding sling)  ?? [place]  ?? UPPER GASTROINTESTINAL ENDOSCOPY  01/06/2015  ? ?Social History  ? ?Social History Narrative  ?? Not on file  ? ?Immunization History  ?Administered Date(s) Administered  ?? Moderna Sars-Covid-2 Vaccination 08/17/2019, 09/14/2019, 05/16/2020  ?  ? ?Objective: ?Vital Signs: There were no vitals taken for this visit.  ? ?Physical Exam  ? ?Musculoskeletal Exam: *** ? ?CDAI Exam: ?CDAI Score: -- ?Patient Global: --; Provider Global: -- ?Swollen: --; Tender: -- ?Joint Exam 09/11/2021  ? ?No joint exam has been documented for this visit  ? ?There is currently no information documented on the homunculus. Go to the Rheumatology activity and complete the homunculus joint exam. ? ?Investigation: ?No additional findings. ? ?Imaging: ?No results found. ? ?Recent Labs: ?Lab Results  ?Component Value Date  ? WBC 4.5 04/14/2021  ? HGB 13.7 04/14/2021  ? PLT 226 04/14/2021  ? NA 141 04/14/2021  ? K 4.2 04/14/2021  ? CL 104 04/14/2021  ? CO2 30 04/14/2021  ? GLUCOSE 82 04/14/2021  ? BUN 16 04/14/2021  ? CREATININE 0.86 04/14/2021  ? BILITOT 0.4 04/14/2021  ? ALKPHOS 66 09/02/2020  ? AST 20 04/14/2021  ? ALT 14 04/14/2021  ? PROT 7.2 04/14/2021  ? ALBUMIN 4.4 09/02/2020  ? CALCIUM 10.3 04/14/2021  ? ? ?Speciality Comments: Dxd by Dr. Cristal Deer ?[facility] [place] 2022 per pt. ? ?PLQ Eye Exam: 02/26/2021 WNL @  [facility]  ? ?Procedures:  ?No procedures performed ?Allergies: Contrast media [iodinated contrast media], Iodine, Lisinopril, Macrobid [nitrofurantoin], Metrizamide, Moxifloxacin, and Prednisone  ? ?Assessment / Plan:     ?Visit Diagnoses: No diagnosis found. ? ?Orders: ?No orders of the defined types were placed in this encounter. ? ?No orders of the defined types were placed in this encounter. ? ? ?Face-to-face time spent with patient was *** minutes. Greater than 50% of time was spent in counseling and coordination of care. ? ?Follow-Up Instructions: No follow-ups on file. ? ? ?Earnestine Mealing, CMA ? ?Note - This record has been created using [employer].  ?Chart creation errors have been sought, but may not always  ?have been located. Such creation errors do not reflect on  ?the standard of medical care.  ?

## 2021-09-09 ENCOUNTER — Encounter: Payer: Self-pay | Admitting: Physician Assistant

## 2021-09-09 ENCOUNTER — Telehealth: Payer: Self-pay

## 2021-09-09 ENCOUNTER — Ambulatory Visit (INDEPENDENT_AMBULATORY_CARE_PROVIDER_SITE_OTHER)
Admission: RE | Admit: 2021-09-09 | Discharge: 2021-09-09 | Disposition: A | Discharge status: Home / Self Care | Payer: Medicare Other | Source: Ambulatory Visit | Attending: Physician Assistant | Admitting: Physician Assistant

## 2021-09-09 ENCOUNTER — Ambulatory Visit (INDEPENDENT_AMBULATORY_CARE_PROVIDER_SITE_OTHER): Payer: Medicare Other | Admitting: Physician Assistant

## 2021-09-09 VITALS — BP 124/68 | HR 97 | Ht 64.0 in | Wt 122.2 lb

## 2021-09-09 DIAGNOSIS — R948 Abnormal results of function studies of other organs and systems: Secondary | ICD-10-CM

## 2021-09-09 DIAGNOSIS — R1032 Left lower quadrant pain: Secondary | ICD-10-CM

## 2021-09-09 DIAGNOSIS — K5900 Constipation, unspecified: Secondary | ICD-10-CM | POA: Diagnosis not present

## 2021-09-09 DIAGNOSIS — K219 Gastro-esophageal reflux disease without esophagitis: Secondary | ICD-10-CM

## 2021-09-09 NOTE — Patient Instructions (Signed)
Your provider has requested that you go to the basement level for an x-ray of your abdomen before leaving today. Press "B" on the elevator for the x-ray department and follow the signs. ? ?Due to recent changes in healthcare laws, you may see the results of your imaging and laboratory studies on MyChart before your provider has had a chance to review them.  We understand that in some cases there may be results that are confusing or concerning to you. Not all laboratory results come back in the same time frame and the provider may be waiting for multiple results in order to interpret others.  Please give [place] 48 hours in order for your provider to thoroughly review all the results before contacting the office for clarification of your results.  ? ?The [facility] providers would like to encourage you to use [facility] to communicate with providers for non-urgent requests or questions.  Due to long hold times on the telephone, sending your provider a message by [facility] may be a faster and more efficient way to get a response.  Please allow 48 business hours for a response.  Please remember that this is for non-urgent requests.  ? ?

## 2021-09-09 NOTE — Telephone Encounter (Signed)
Informed patient that no further work for abnormal HIDA scan needs to be done per Dr. Henrene Pastor and [name], PA. Patient verbalized understanding. ?

## 2021-09-09 NOTE — Progress Notes (Signed)
? ?Chief Complaint: Gallstones, nausea and vomiting ? ?HPI: ?   Carol Scott is a 71 year old female with a past medical history as listed below including reflux, known to Dr. Henrene Pastor, who was referred to me by Pomposini, Cherly Anderson, MD for a complaint of gallstones with nausea and vomiting. ?   01/04/2018 colonoscopy with a small sessile serrated polyp in addition to hemorrhoids, otherwise normal.  Repeat recommended in 5 years.  EGD at the same time was normal. ?   07/09/2020 patient had office visit with [place] for abdominal pain and bloody diarrhea with a CT showing pancolitis.  GI path panel showed C. difficile.  Treated with a 2-week course of Vancomycin and diarrhea resolved.  2 weeks later she described recurrent diarrhea and was given Vancomycin 125 4 times daily x2 weeks.  ?   08/18/2020 patient seen in clinic by Dr. Henrene Pastor for follow-up regarding treatment of recent C. difficile and abnormal biliary imaging.  That time her bowel movements had improved.  On her CT scan elsewhere she was noted to have a mildly dilated bile duct without acute abnormalities.  MRCP showed a question of a 7 mm stone at the level of the ampulla.  Gallbladder was normal without gallstones.  At that time continued on Vancomycin 125 4 times daily.  At that time was recommended she have an EUS with option to proceed ERCP if a common duct stone was seen. ?   10/09/2020 EUS with CBD measuring 6 mm and containing no stones or soft tissue masses, there was a small periampullary diverticulum about 1 cm cephalad to the major papula which was seen best in the side-viewing duodenoscope.  This may have contributed to a false positive MRCP. ?   08/06/2021 patient seen in clinic for medical annual wellness visit.  At that time discussed that she had COVID around Thanksgiving and had decreased appetite with weight loss and upper abdominal pain that occurred 30 minutes to an hour after eating associated flatulence once a week since then.  At that time had an  abdominal ultrasound ordered as well as amylase and lipase.  She was told to continue Famotidine 40 mg daily.  TSH normal.  T4 normal.  Lipase and amylase normal.  Hepatic function panel normal.  BMP normal.  CBC normal. ?   08/19/2021 abdominal ultrasound for abdominal pain was unremarkable, specifically no sonographic evidence for acute cholecystitis. ?   08/31/2021 HIDA scan with CCK done for abdominal pain.  At that time findings were suggestive of biliary obstruction with likely choledocholithiasis.  Recommended an MRCP. ?   Today, the patient presents to clinic and tells me that back around November/December timeframe she started with an upper abdominal pain that would typically start immediately after eating and made her somewhat nauseous and was also somewhat painful or she would get both symptoms.  Due to this she was hesitant to eat at all and had a decreased appetite.  Tells me that she then had all of this work-up.  Actually over the past 4 to 5 days she has had no further abdominal pain or nausea.  She continues her Pantoprazole 40 mg in the morning and Pepcid 20 mg at bedtime.  Denies any increase in heartburn or reflux symptoms.  Asked about abnormal imaging recently. ?   Also discusses today that she has a new left lower quadrant discomfort which sometimes wakes her up from her sleep and feels like it is "hard across my abdomen".  Tells me that  this will just occur here and there and is not always in the evening.  It has been going on for at least the past 10 days and seems to just come and go.  She has not noticed if this is any better after a bowel movement but typically after she has this pain she will then have a large bowel within the next day.  She is chronically constipated and was previously on MiraLAX but then started using Citrucel which she tells me she uses as needed which ends up being almost every day in order to maintain daily bowel movements. ?   Denies fever, chills, weight loss,  vomiting or blood in her stool. ? ?Past Medical History:  ?Diagnosis Date  ? Anal fissure   ? Anxiety   ? Cervical spondylosis without myelopathy   ? Connective tissue disease ([place])   ? 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 DDD (degenerative disc disease), cervical   ? GERD (gastroesophageal reflux disease)   ? Hemorrhoids   ? History of concussion   ? 04-22-2020 per pt hit head no loc 05/ 2021, residual balance issue and headaches (headaches resolved but still have balance issues)  ? History of COVID-19   ? History of palpitations all test results in care everywhere  (04-22-2020  per pt all symptoms resolved)  ? per pt sent to cardiologist, dr Clayborn Bigness, 07/ 2021 (note in epic)  for palpatitions , poss. angia at rest, sob & r/o afib;  had 72 hour monitor (per note 04-07-2020 SR -- ST biphasic p waves no evidence afib/ flutter),  echo 03-28-2020 normal w/ ef >55%, and nuclear stress test 03-04-2020 borderline abnormal notable for poor uptake during rest , normal perfusion, and no evidence ischemia, ef 66%   ? Hypertension   ? followed by pcp  ? Mitral valve prolapse   ? Multiple thyroid nodules   ? 04-22-2020  per pt has had previous bx's which were benign , followed by pcp  ? OA (osteoarthritis)   ? shoudlers, knees  ? Perianal pain   ? PONV (postoperative nausea and vomiting)   ? Vitamin D deficiency   ? Wears glasses   ? ? ?Past Surgical History:  ?Procedure Laterality Date  ? BOTOX INJECTION N/A 04/24/2020  ? Procedure: POSSIBLE BOTOX INJECTION;  Surgeon: Ileana Roup, MD;  Location: [facility];  Service: General;  Laterality: N/A;  ? COLONOSCOPY WITH PROPOFOL  01-04-2018  dr Henrene Pastor  ? ESOPHAGOGASTRODUODENOSCOPY (EGD) WITH PROPOFOL N/A 10/09/2020  ? Procedure: ESOPHAGOGASTRODUODENOSCOPY (EGD) WITH PROPOFOL;  Surgeon: Milus Banister, MD;  Location: [facility];  Service: Endoscopy;  Laterality: N/A;  ? EUS N/A 10/09/2020  ? Procedure: UPPER ENDOSCOPIC  ULTRASOUND (EUS) RADIAL;  Surgeon: Milus Banister, MD;  Location: [facility];  Service: Endoscopy;  Laterality: N/A;  ? HEMORRHOID SURGERY  2005  ? RECTAL EXAM UNDER ANESTHESIA N/A 04/24/2020  ? Procedure: ANORECTAL EXAM UNDER ANESTHESIA, EXCISION OF PERIANAL LESION; BOTOX INJECTION;  Surgeon: Ileana Roup, MD;  Location: [place];  Service: General;  Laterality: N/A;  ? SUPRACERVICAL ABDOMINAL HYSTERECTOMY  2006  ? WITH BILATERAL SALPINGOOPHORECTOMY AND PELVIC FLOOR REPAIR (possible inculding sling)  ? TONSILLECTOMY AND ADENOIDECTOMY  1960  ? UPPER GASTROINTESTINAL ENDOSCOPY  01/06/2015  ? ? ?Current Outpatient Medications  ?Medication Sig Dispense Refill  ? atorvastatin (LIPITOR) 20 MG tablet atorvastatin 20 mg tablet ? Take 1 tablet every day by oral route.    ?  cetirizine (ZYRTEC) 10 MG tablet Take 10 mg by mouth daily.    ? Cholecalciferol (VITAMIN D3) 1.25 MG (50000 UT) TABS Take 50,000 Units by mouth once a week.    ? famotidine (PEPCID) 40 MG tablet Take 40 mg by mouth as needed.    ? fluticasone (FLONASE) 50 MCG/ACT nasal spray Place into both nostrils daily as needed for allergies or rhinitis.    ? hydrochlorothiazide (MICROZIDE) 12.5 MG capsule hydrochlorothiazide 12.5 mg capsule ? Take 1 capsule every day by oral route.    ? hydroxychloroquine (PLAQUENIL) 200 MG tablet Take 1 tablet (200 mg total) by mouth daily. 90 tablet 0  ? losartan (COZAAR) 100 MG tablet Take 100 mg by mouth daily.     ? Methylcellulose, Laxative, (CITRUCEL PO) Take 15 mLs by mouth daily as needed (Constipation). With water    ? pantoprazole (PROTONIX) 40 MG tablet TAKE 1 TABLET BY MOUTH EVERY DAY (Patient taking differently: Take 40 mg by mouth daily.) 30 tablet 6  ? Probiotic Product (ALIGN) 4 MG CAPS Take 4 mg by mouth daily.    ? ?No current facility-administered medications for this visit.  ? ? ?Allergies as of 09/09/2021 - Review Complete 09/09/2021  ?Allergen Reaction Noted  ? Contrast media  [iodinated contrast media] Other (See Comments) 12/31/2014  ? Iodine Other (See Comments) 08/18/2020  ? Lisinopril  12/29/2017  ? Macrobid [nitrofurantoin] Nausea Only 04/22/2020  ? Metrizamide Other (See Commen

## 2021-09-09 NOTE — Progress Notes (Signed)
Reviewed. ?Regarding your question, nothing further needs to be done in terms of imaging.  The pancreaticobiliary system is normal. ?Thank you ?

## 2021-09-09 NOTE — Telephone Encounter (Signed)
-----   Message from Stanton sent at 09/09/2021  2:40 PM EDT ----- ?Regarding: RE: please call ?Oh ok. I understand. I will call her. ?----- Message ----- ?From: Levin Erp, PA ?Sent: 09/09/2021   2:40 PM EDT ?To: Lindon Romp, CMA ?Subject: RE: please call                               ? ?That is completely fine.  That is for her constipation.  This is in regards to her abnormal HIDA scan and further biliary imaging like an MRI.  I told her I did not think it was necessary but told her I did discuss with Dr. Henrene Pastor. ? ?Thanks, JLL ?----- Message ----- ?From: Lindon Romp, CMA ?Sent: 09/09/2021   2:39 PM EDT ?To: Levin Erp, PA ?Subject: RE: please call                               ? ?She has already went to our basement to get the KUB. Hope that's ok.  ?----- Message ----- ?From: Levin Erp, PA ?Sent: 09/09/2021   2:38 PM EDT ?To: Lindon Romp, CMA ?Subject: please call                                   ? ?Please call patient and let her know that Dr. Henrene Pastor reviewed her imaging.  He does not think she needs any further work-up for that at this point. ? ?Thanks, JLL ? ? ? ? ?

## 2021-09-10 NOTE — Progress Notes (Signed)
? ?Office Visit Note ? ?Patient: Carol Scott             ?Date of Birth: 11-15-1950           ?MRN: [id number]             ?PCP: Pomposini, Cherly Anderson, MD ?Referring: Clinton Quant, MD ?Visit Date: 09/15/2021 ?Occupation: '@GUAROCC'$ @ ? ?Subjective:  ?Medication management ? ?History of Present Illness: Shanik Brookshire is a 71 y.o. female with history of autoimmune disease.  She reduced the dose of hydroxychloroquine to 1 tablet p.o. daily after her lab results came back with high hydroxychloroquine levels.  She has not noticed any worsening of her symptoms.  She denies any joint pain or joint swelling.  She continues to have dry eyes.  She denies any history of dry mouth.  She has not had a nasal ulcer in 1 year.  There is no history of fatigue, malar rash, Raynaud's phenomenon, lymphadenopathy or photosensitivity. ? ?Activities of Daily Living:  ?Patient reports morning stiffness for 1 hour.   ?Patient Denies nocturnal pain.  ?Difficulty dressing/grooming: Denies ?Difficulty climbing stairs: Denies ?Difficulty getting out of chair: Denies ?Difficulty using hands for taps, buttons, cutlery, and/or writing: Reports ? ?Review of Systems  ?Constitutional:  Negative for fatigue.  ?HENT:  Negative for mouth sores, mouth dryness and nose dryness.   ?Eyes:  Positive for pain, itching and dryness.  ?Respiratory:  Negative for shortness of breath and difficulty breathing.   ?Cardiovascular:  Negative for chest pain and palpitations.  ?Gastrointestinal:  Positive for constipation. Negative for blood in stool and diarrhea.  ?Endocrine: Negative for increased urination.  ?Genitourinary:  Negative for difficulty urinating.  ?Musculoskeletal:  Positive for joint pain, joint pain, myalgias, morning stiffness and myalgias. Negative for joint swelling and muscle tenderness.  ?Skin:  Negative for color change, rash and redness.  ?Allergic/Immunologic: Negative for susceptible to infections.  ?Neurological:  Positive for headaches.  Negative for dizziness, numbness, memory loss and weakness.  ?Hematological:  Positive for bruising/bleeding tendency.  ?Psychiatric/Behavioral:  Negative for confusion.   ? ?PMFS History:  ?Patient Active Problem List  ? Diagnosis Date Noted  ? Autoimmune disease ([place]) 05/14/2021  ? High risk medication use 05/14/2021  ? Primary osteoarthritis of both hands 05/14/2021  ? Primary osteoarthritis of both feet 05/14/2021  ? Arthropathy of lumbar facet joint 05/14/2021  ? Vitamin D deficiency 05/14/2021  ? Essential hypertension 05/14/2021  ? History of hyperlipidemia 05/14/2021  ? History of gastroesophageal reflux (GERD) 05/14/2021  ? History of kidney stones 05/14/2021  ? Diarrhea 07/14/2020  ? Infectious colitis 07/11/2020  ? Common bile duct dilation 07/11/2020  ? Nausea without vomiting 01/01/2015  ? Abdominal pain, epigastric 01/01/2015  ?  ?Past Medical History:  ?Diagnosis Date  ? Anal fissure   ? Anxiety   ? Cervical spondylosis without myelopathy   ? Connective tissue disease ([place])   ? 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 DDD (degenerative disc disease), cervical   ? GERD (gastroesophageal reflux disease)   ? Hemorrhoids   ? History of concussion   ? 04-22-2020 per pt hit head no loc 05/ 2021, residual balance issue and headaches (headaches resolved but still have balance issues)  ? History of COVID-19   ? History of palpitations all test results in care everywhere  (04-22-2020  per pt all symptoms resolved)  ? per pt sent to cardiologist, dr Clayborn Bigness, 07/ 2021 (note in epic)  for palpatitions ,  poss. angia at rest, sob & r/o afib;  had 72 hour monitor (per note 04-07-2020 SR -- ST biphasic p waves no evidence afib/ flutter),  echo 03-28-2020 normal w/ ef >55%, and nuclear stress test 03-04-2020 borderline abnormal notable for poor uptake during rest , normal perfusion, and no evidence ischemia, ef 66%   ? Hypertension   ? followed by pcp  ? Mitral valve prolapse   ?  Multiple thyroid nodules   ? 04-22-2020  per pt has had previous bx's which were benign , followed by pcp  ? OA (osteoarthritis)   ? shoudlers, knees  ? Perianal pain   ? PONV (postoperative nausea and vomiting)   ? Vitamin D deficiency   ? Wears glasses   ?  ?Family History  ?Problem Relation Age of Onset  ? Breast cancer Mother   ? Diabetes Mother   ? Prostate cancer Father   ? Diabetes Father   ? Heart disease Father   ? Breast cancer Sister   ? Dementia Sister   ? Prostate cancer Brother   ? Breast cancer Maternal Aunt   ? Healthy Son   ? Healthy Son   ? Healthy Daughter   ? Colon cancer Neg Hx   ? Esophageal cancer Neg Hx   ? Rectal cancer Neg Hx   ? Stomach cancer Neg Hx   ? ?Past Surgical History:  ?Procedure Laterality Date  ? BOTOX INJECTION N/A 04/24/2020  ? Procedure: POSSIBLE BOTOX INJECTION;  Surgeon: Ileana Roup, MD;  Location: [facility];  Service: General;  Laterality: N/A;  ? COLONOSCOPY WITH PROPOFOL  01-04-2018  dr Henrene Pastor  ? ESOPHAGOGASTRODUODENOSCOPY (EGD) WITH PROPOFOL N/A 10/09/2020  ? Procedure: ESOPHAGOGASTRODUODENOSCOPY (EGD) WITH PROPOFOL;  Surgeon: Milus Banister, MD;  Location: [facility];  Service: Endoscopy;  Laterality: N/A;  ? EUS N/A 10/09/2020  ? Procedure: UPPER ENDOSCOPIC ULTRASOUND (EUS) RADIAL;  Surgeon: Milus Banister, MD;  Location: [facility];  Service: Endoscopy;  Laterality: N/A;  ? HEMORRHOID SURGERY  2005  ? RECTAL EXAM UNDER ANESTHESIA N/A 04/24/2020  ? Procedure: ANORECTAL EXAM UNDER ANESTHESIA, EXCISION OF PERIANAL LESION; BOTOX INJECTION;  Surgeon: Ileana Roup, MD;  Location: [place];  Service: General;  Laterality: N/A;  ? SUPRACERVICAL ABDOMINAL HYSTERECTOMY  2006  ? WITH BILATERAL SALPINGOOPHORECTOMY AND PELVIC FLOOR REPAIR (possible inculding sling)  ? TONSILLECTOMY AND ADENOIDECTOMY  1960  ? UPPER GASTROINTESTINAL ENDOSCOPY  01/06/2015  ? ?Social History  ? ?Social History Narrative  ? Not on file   ? ?Immunization History  ?Administered Date(s) Administered  ? Moderna Sars-Covid-2 Vaccination 08/17/2019, 09/14/2019, 05/16/2020  ?  ? ?Objective: ?Vital Signs: BP (!) 144/76 (BP Location: Left Arm, Patient Position: Sitting, Cuff Size: Normal)   Pulse 74   Ht '5\' 4"'$  (1.626 m)   Wt 121 lb 12.8 oz (55.2 kg)   BMI 20.91 kg/m?   ? ?Physical Exam ?Vitals and nursing note reviewed.  ?Constitutional:   ?   Appearance: She is well-developed.  ?HENT:  ?   Head: Normocephalic and atraumatic.  ?Eyes:  ?   Conjunctiva/sclera: Conjunctivae normal.  ?Cardiovascular:  ?   Rate and Rhythm: Normal rate and regular rhythm.  ?   Heart sounds: Normal heart sounds.  ?Pulmonary:  ?   Effort: Pulmonary effort is normal.  ?   Breath sounds: Normal breath sounds.  ?Abdominal:  ?   General: Bowel sounds are normal.  ?   Palpations: Abdomen is soft.  ?  Musculoskeletal:  ?   Cervical back: Normal range of motion.  ?Lymphadenopathy:  ?   Cervical: No cervical adenopathy.  ?Skin: ?   General: Skin is warm and dry.  ?   Capillary Refill: Capillary refill takes less than 2 seconds.  ?   Comments: Facial erythema noted.  ?Neurological:  ?   Mental Status: She is alert and oriented to person, place, and time.  ?Psychiatric:     ?   Behavior: Behavior normal.  ?  ? ?Musculoskeletal Exam: C-spine was in good range of motion.  Shoulder joints, elbow joints, wrist joints, MCPs PIPs and DIPs with good range of motion.  She had mild PIP and DIP thickening.  Hip joints and knee joints with good range of motion.  She had bilateral bunions.  PIP and DIP thickening was noted in her feet with no synovitis. ? ?CDAI Exam: ?CDAI Score: -- ?Patient Global: --; Provider Global: -- ?Swollen: --; Tender: -- ?Joint Exam 09/15/2021  ? ?No joint exam has been documented for this visit  ? ?There is currently no information documented on the homunculus. Go to the Rheumatology activity and complete the homunculus joint exam. ? ?Investigation: ?No additional  findings. ? ?Imaging: ?DG Abd 1 View ? ?Result Date: 09/10/2021 ?CLINICAL DATA:  Left lower quadrant abdominal pain, nausea, constipation EXAM: ABDOMEN - 1 VIEW COMPARISON:  None. FINDINGS: Normal abdominal gas pattern. [place]

## 2021-09-11 ENCOUNTER — Ambulatory Visit: Payer: Medicare Other | Admitting: Rheumatology

## 2021-09-11 DIAGNOSIS — M47816 Spondylosis without myelopathy or radiculopathy, lumbar region: Secondary | ICD-10-CM

## 2021-09-11 DIAGNOSIS — M359 Systemic involvement of connective tissue, unspecified: Secondary | ICD-10-CM

## 2021-09-11 DIAGNOSIS — M791 Myalgia, unspecified site: Secondary | ICD-10-CM

## 2021-09-11 DIAGNOSIS — Z8719 Personal history of other diseases of the digestive system: Secondary | ICD-10-CM

## 2021-09-11 DIAGNOSIS — Z87442 Personal history of urinary calculi: Secondary | ICD-10-CM

## 2021-09-11 DIAGNOSIS — E041 Nontoxic single thyroid nodule: Secondary | ICD-10-CM

## 2021-09-11 DIAGNOSIS — Z79899 Other long term (current) drug therapy: Secondary | ICD-10-CM

## 2021-09-11 DIAGNOSIS — M19072 Primary osteoarthritis, left ankle and foot: Secondary | ICD-10-CM

## 2021-09-11 DIAGNOSIS — M19041 Primary osteoarthritis, right hand: Secondary | ICD-10-CM

## 2021-09-11 DIAGNOSIS — Z8619 Personal history of other infectious and parasitic diseases: Secondary | ICD-10-CM

## 2021-09-11 DIAGNOSIS — E559 Vitamin D deficiency, unspecified: Secondary | ICD-10-CM

## 2021-09-11 DIAGNOSIS — Z8639 Personal history of other endocrine, nutritional and metabolic disease: Secondary | ICD-10-CM

## 2021-09-11 DIAGNOSIS — I10 Essential (primary) hypertension: Secondary | ICD-10-CM

## 2021-09-14 ENCOUNTER — Ambulatory Visit: Payer: Medicare Other | Admitting: Rheumatology

## 2021-09-15 ENCOUNTER — Encounter: Payer: Self-pay | Admitting: Rheumatology

## 2021-09-15 ENCOUNTER — Ambulatory Visit (INDEPENDENT_AMBULATORY_CARE_PROVIDER_SITE_OTHER): Payer: Medicare Other | Admitting: Rheumatology

## 2021-09-15 VITALS — BP 144/76 | HR 74 | Ht 64.0 in | Wt 121.8 lb

## 2021-09-15 DIAGNOSIS — Z8719 Personal history of other diseases of the digestive system: Secondary | ICD-10-CM

## 2021-09-15 DIAGNOSIS — M19042 Primary osteoarthritis, left hand: Secondary | ICD-10-CM

## 2021-09-15 DIAGNOSIS — Z79899 Other long term (current) drug therapy: Secondary | ICD-10-CM

## 2021-09-15 DIAGNOSIS — Z87442 Personal history of urinary calculi: Secondary | ICD-10-CM

## 2021-09-15 DIAGNOSIS — I10 Essential (primary) hypertension: Secondary | ICD-10-CM

## 2021-09-15 DIAGNOSIS — Z8639 Personal history of other endocrine, nutritional and metabolic disease: Secondary | ICD-10-CM

## 2021-09-15 DIAGNOSIS — M19041 Primary osteoarthritis, right hand: Secondary | ICD-10-CM

## 2021-09-15 DIAGNOSIS — M19071 Primary osteoarthritis, right ankle and foot: Secondary | ICD-10-CM

## 2021-09-15 DIAGNOSIS — E041 Nontoxic single thyroid nodule: Secondary | ICD-10-CM

## 2021-09-15 DIAGNOSIS — M47816 Spondylosis without myelopathy or radiculopathy, lumbar region: Secondary | ICD-10-CM

## 2021-09-15 DIAGNOSIS — E559 Vitamin D deficiency, unspecified: Secondary | ICD-10-CM

## 2021-09-15 DIAGNOSIS — M791 Myalgia, unspecified site: Secondary | ICD-10-CM

## 2021-09-15 DIAGNOSIS — M19072 Primary osteoarthritis, left ankle and foot: Secondary | ICD-10-CM

## 2021-09-15 DIAGNOSIS — M359 Systemic involvement of connective tissue, unspecified: Secondary | ICD-10-CM | POA: Diagnosis not present

## 2021-09-15 DIAGNOSIS — Z8619 Personal history of other infectious and parasitic diseases: Secondary | ICD-10-CM

## 2021-09-24 LAB — COMPLETE METABOLIC PANEL WITH GFR
AG Ratio: 1.6 (calc) (ref 1.0–2.5)
ALT: 14 U/L (ref 6–29)
AST: 19 U/L (ref 10–35)
Albumin: 4.4 g/dL (ref 3.6–5.1)
Alkaline phosphatase (APISO): 65 U/L (ref 37–153)
BUN: 16 mg/dL (ref 7–25)
CO2: 32 mmol/L (ref 20–32)
Calcium: 10.1 mg/dL (ref 8.6–10.4)
Chloride: 102 mmol/L (ref 98–110)
Creat: 0.9 mg/dL (ref 0.60–1.00)
Globulin: 2.7 g/dL (calc) (ref 1.9–3.7)
Glucose, Bld: 92 mg/dL (ref 65–99)
Potassium: 4.8 mmol/L (ref 3.5–5.3)
Sodium: 141 mmol/L (ref 135–146)
Total Bilirubin: 0.5 mg/dL (ref 0.2–1.2)
Total Protein: 7.1 g/dL (ref 6.1–8.1)
eGFR: 69 mL/min/{1.73_m2} (ref >=60)

## 2021-09-24 LAB — SEDIMENTATION RATE: Sed Rate: 2 mm/h (ref 0–30)

## 2021-09-24 LAB — CBC WITH DIFFERENTIAL/PLATELET
Absolute Monocytes: 360 cells/uL (ref 200–950)
Basophils Absolute: 38 cells/uL (ref 0–200)
Basophils Relative: 0.8 %
Eosinophils Absolute: 168 cells/uL (ref 15–500)
Eosinophils Relative: 3.5 %
HCT: 43 % (ref 35.0–45.0)
Hemoglobin: 13.9 g/dL (ref 11.7–15.5)
Lymphs Abs: 1171 cells/uL (ref 850–3900)
MCH: 29.2 pg (ref 27.0–33.0)
MCHC: 32.3 g/dL (ref 32.0–36.0)
MCV: 90.3 fL (ref 80.0–100.0)
MPV: 9.9 fL (ref 7.5–12.5)
Monocytes Relative: 7.5 %
Neutro Abs: 3062 cells/uL (ref 1500–7800)
Neutrophils Relative %: 63.8 %
Platelets: 244 10*3/uL (ref 140–400)
RBC: 4.76 10*6/uL (ref 3.80–5.10)
RDW: 12.6 % (ref 11.0–15.0)
Total Lymphocyte: 24.4 %
WBC: 4.8 10*3/uL (ref 3.8–10.8)

## 2021-09-24 LAB — PROTEIN / CREATININE RATIO, URINE
Creatinine, Urine: 18 mg/dL — ABNORMAL LOW (ref 20–275)
Total Protein, Urine: <4 mg/dL — ABNORMAL LOW (ref 5–24)

## 2021-09-24 LAB — HYDROXYCHLOROQUINE,BLOOD: HYDROXYCHLOROQUINE, (B): 780 ng/mL — ABNORMAL HIGH

## 2021-09-24 LAB — C3 AND C4
C3 Complement: 126 mg/dL (ref 83–193)
C4 Complement: 18 mg/dL (ref 15–57)

## 2021-09-24 LAB — ANA: Anti Nuclear Antibody (ANA): NEGATIVE

## 2021-09-24 LAB — ANTI-DNA ANTIBODY, DOUBLE-STRANDED: ds DNA Ab: <1 IU/mL

## 2021-09-24 NOTE — Progress Notes (Signed)
Please notify the patient of the above result dictation.

## 2021-09-24 NOTE — Progress Notes (Signed)
UA negative for protein, hydroxychloroquine level therapeutic, ANA negative,

## 2021-09-29 ENCOUNTER — Other Ambulatory Visit: Payer: Self-pay | Admitting: Rheumatology

## 2021-09-29 MED ORDER — HYDROXYCHLOROQUINE SULFATE 200 MG PO TABS: 200.0000 mg | ORAL_TABLET | Freq: Every day | ORAL | 0 refills | Status: DC

## 2021-09-29 NOTE — Telephone Encounter (Signed)
Patient called the office requesting a refill of Plaquenil '200mg'$  to be sent to [facility] in [place] on [address]. Patient states Dr. Estanislado Pandy has not filled this medication for her before. ?

## 2021-09-29 NOTE — Telephone Encounter (Signed)
Next Visit: 02/18/2022 ? ?Last Visit: 09/15/2021 ? ?Labs: 09/15/2021 CBC and CMP WNL.  ? ?Eye exam: 02/26/2021 WNL   ? ?Current Dose per office note 09/15/2021: - Plaquenil dose was reduced to 200 mg p.o daily after HCQ level resulted. ? ?PZ:WCHENIDPOE disease  ? ?Okay to refill Plaquenil?  ?

## 2021-12-26 ENCOUNTER — Other Ambulatory Visit: Payer: Self-pay | Admitting: Rheumatology

## 2021-12-28 NOTE — Telephone Encounter (Signed)
Next Visit: 02/18/2022   Last Visit: 09/15/2021   Labs: 09/15/2021 CBC and CMP WNL.    Eye exam: 02/26/2021 WNL     Current Dose per office note 09/15/2021: - Plaquenil dose was reduced to 200 mg p.o daily after HCQ level resulted.   DX: Autoimmune disease  Last Fill: 09/29/2021    Okay to refill Plaquenil?

## 2022-02-05 NOTE — Progress Notes (Signed)
Office Visit Note  Patient: Carol Scott             Date of Birth: 04/11/1951           MRN: [id number]             PCP: Clinton Quant, MD Referring: Clinton Quant, MD Visit Date: 02/18/2022 Occupation: @GUAROCC @  Subjective:  Medication monitoring  History of Present Illness: Carol Scott is a 71 y.o. female with history of autoimmune disease and osteoarthritis.  Patient is currently taking Plaquenil 200 mg 1 tablet by mouth daily.  She is tolerating Plaquenil without any side effects and has not missed any doses recently.  She denies any signs or symptoms of an autoimmune disease flare.  She has not had any recent rashes, hair loss, Raynaud's phenomenon, photosensitive rash, oral or nasal ulcerations, swollen lymph nodes, shortness of breath, or pleuritic chest pain.  Her energy level has been stable.  She has been walking as well as riding her stationary bike for exercise.  She has occasional discomfort in her hands but denies any joint swelling.  She experiences stiffness in her hips especially first thing in the morning. According to the patient her PCP checks her bone densities.  She is currently taking vitamin D3 1000 units once weekly.    Activities of Daily Living:  Patient reports morning stiffness for all day. Patient Denies nocturnal pain.  Difficulty dressing/grooming: Denies Difficulty climbing stairs: Denies Difficulty getting out of chair: Denies Difficulty using hands for taps, buttons, cutlery, and/or writing: Reports  Review of Systems  Constitutional:  Negative for fatigue.  HENT:  Negative for mouth sores and mouth dryness.   Eyes:  Positive for dryness.  Respiratory:  Negative for shortness of breath.   Cardiovascular:  Negative for chest pain and palpitations.  Gastrointestinal:  Positive for constipation. Negative for blood in stool and diarrhea.  Endocrine: Negative for increased urination.  Genitourinary:  Negative for involuntary  urination.  Musculoskeletal:  Positive for joint pain, joint pain, myalgias, morning stiffness, muscle tenderness and myalgias. Negative for gait problem, joint swelling and muscle weakness.  Skin:  Positive for sensitivity to sunlight. Negative for color change, rash and hair loss.  Allergic/Immunologic: Positive for susceptible to infections.  Neurological:  Positive for dizziness and headaches.  Hematological:  Negative for swollen glands.  Psychiatric/Behavioral:  Negative for depressed mood and sleep disturbance. The patient is not nervous/anxious.     PMFS History:  Patient Active Problem List   Diagnosis Date Noted   Autoimmune disease ([place]) 05/14/2021   High risk medication use 05/14/2021   Primary osteoarthritis of both hands 05/14/2021   Primary osteoarthritis of both feet 05/14/2021   Arthropathy of lumbar facet joint 05/14/2021   Vitamin D deficiency 05/14/2021   Essential hypertension 05/14/2021   History of hyperlipidemia 05/14/2021   History of gastroesophageal reflux (GERD) 05/14/2021   History of kidney stones 05/14/2021   Diarrhea 07/14/2020   Infectious colitis 07/11/2020   Common bile duct dilation 07/11/2020   Nausea without vomiting 01/01/2015   Abdominal pain, epigastric 01/01/2015    Past Medical History:  Diagnosis Date   Anal fissure    Anxiety    Cervical spondylosis without myelopathy    Connective tissue disease ([place])    rheumotology-- dr Ena Dawley ([place], [place])--- autoimmune , takes plaquenil   DDD (degenerative disc disease), cervical    GERD (gastroesophageal reflux disease)    Hemorrhoids    History of concussion  04-22-2020 per pt hit head no loc 05/ 2021, residual balance issue and headaches (headaches resolved but still have balance issues)   History of COVID-19    History of palpitations all test results in care everywhere  (04-22-2020  per pt all symptoms resolved)   per pt sent to cardiologist, dr Clayborn Bigness, 07/ 2021 (note in epic)  for  palpatitions , poss. angia at rest, sob & r/o afib;  had 72 hour monitor (per note 04-07-2020 SR -- ST biphasic p waves no evidence afib/ flutter),  echo 03-28-2020 normal w/ ef >55%, and nuclear stress test 03-04-2020 borderline abnormal notable for poor uptake during rest , normal perfusion, and no evidence ischemia, ef 66%    Hypertension    followed by pcp   Mitral valve prolapse    Multiple thyroid nodules    04-22-2020  per pt has had previous bx's which were benign , followed by pcp   OA (osteoarthritis)    shoudlers, knees   Perianal pain    PONV (postoperative nausea and vomiting)    Vitamin D deficiency    Wears glasses     Family History  Problem Relation Age of Onset   Breast cancer Mother    Diabetes Mother    Prostate cancer Father    Diabetes Father    Heart disease Father    Breast cancer Sister    Dementia Sister    Prostate cancer Brother    Breast cancer Maternal Aunt    Healthy Son    Healthy Son    Healthy Daughter    Colon cancer Neg Hx    Esophageal cancer Neg Hx    Rectal cancer Neg Hx    Stomach cancer Neg Hx    Past Surgical History:  Procedure Laterality Date   BOTOX INJECTION N/A 04/24/2020   Procedure: POSSIBLE BOTOX INJECTION;  Surgeon: Ileana Roup, MD;  Location: [facility];  Service: General;  Laterality: N/A;   COLONOSCOPY WITH PROPOFOL  01-04-2018  dr Henrene Pastor   ESOPHAGOGASTRODUODENOSCOPY (EGD) WITH PROPOFOL N/A 10/09/2020   Procedure: ESOPHAGOGASTRODUODENOSCOPY (EGD) WITH PROPOFOL;  Surgeon: Milus Banister, MD;  Location: [facility];  Service: Endoscopy;  Laterality: N/A;   EUS N/A 10/09/2020   Procedure: UPPER ENDOSCOPIC ULTRASOUND (EUS) RADIAL;  Surgeon: Milus Banister, MD;  Location: [facility];  Service: Endoscopy;  Laterality: N/A;   HEMORRHOID SURGERY  2005   RECTAL EXAM UNDER ANESTHESIA N/A 04/24/2020   Procedure: ANORECTAL EXAM UNDER ANESTHESIA, EXCISION OF PERIANAL LESION; BOTOX INJECTION;  Surgeon:  Ileana Roup, MD;  Location: [facility];  Service: General;  Laterality: N/A;   SUPRACERVICAL ABDOMINAL HYSTERECTOMY  2006   WITH BILATERAL SALPINGOOPHORECTOMY AND PELVIC FLOOR REPAIR (possible inculding sling)   TONSILLECTOMY AND ADENOIDECTOMY  1960   UPPER GASTROINTESTINAL ENDOSCOPY  01/06/2015   Social History   Social History Narrative   Not on file   Immunization History  Administered Date(s) Administered   Moderna Sars-Covid-2 Vaccination 08/17/2019, 09/14/2019, 05/16/2020     Objective: Vital Signs: BP (!) 154/79 (BP Location: Left Arm, Patient Position: Sitting, Cuff Size: Small)   Pulse 85   Resp 12   Ht 5' 4.5" (1.638 m)   Wt 121 lb (54.9 kg)   BMI 20.45 kg/m    Physical Exam Vitals and nursing note reviewed.  Constitutional:      Appearance: She is well-developed.  HENT:     Head: Normocephalic and atraumatic.  Eyes:  Conjunctiva/sclera: Conjunctivae normal.  Cardiovascular:     Rate and Rhythm: Normal rate and regular rhythm.     Heart sounds: Normal heart sounds.  Pulmonary:     Effort: Pulmonary effort is normal.     Breath sounds: Normal breath sounds.  Abdominal:     General: Bowel sounds are normal.     Palpations: Abdomen is soft.  Musculoskeletal:     Cervical back: Normal range of motion.  Skin:    General: Skin is warm and dry.     Capillary Refill: Capillary refill takes less than 2 seconds.  Neurological:     Mental Status: She is alert and oriented to person, place, and time.  Psychiatric:        Behavior: Behavior normal.      Musculoskeletal Exam: C-spine, thoracic spine, lumbar spine have good range of motion.  No midline spinal tenderness or SI joint tenderness.  Some sternoclavicular joint thickening, more prominent on the right side.  No tenderness or synovitis over the sternoclavicular joints.  Shoulder joints, elbow joints, wrist joints, MCPs, PIPs, DIPs have good range of motion with no synovitis.   Complete fist formation bilaterally.  Some PIP and DIP thickening consistent with osteoarthritis of both hands.  Hip joints have good range of motion with no groin pain.  Tenderness over bilateral trochanteric bursa.  Knee joints have good range of motion with no warmth or effusion.  Ankle joints have good range of motion with no tenderness or joint swelling.  Bunions noted bilaterally, right foot greater than left.  Some PIP and DIP thickening consistent with osteoarthritis of both feet.  CDAI Exam: CDAI Score: -- Patient Global: --; Provider Global: -- Swollen: --; Tender: -- Joint Exam 02/18/2022   No joint exam has been documented for this visit   There is currently no information documented on the homunculus. Go to the Rheumatology activity and complete the homunculus joint exam.  Investigation: No additional findings.  Imaging: No results found.  Recent Labs: Lab Results  Component Value Date   WBC 4.8 09/15/2021   HGB 13.9 09/15/2021   PLT 244 09/15/2021   NA 141 09/15/2021   K 4.8 09/15/2021   CL 102 09/15/2021   CO2 32 09/15/2021   GLUCOSE 92 09/15/2021   BUN 16 09/15/2021   CREATININE 0.90 09/15/2021   BILITOT 0.5 09/15/2021   ALKPHOS 66 09/02/2020   AST 19 09/15/2021   ALT 14 09/15/2021   PROT 7.1 09/15/2021   ALBUMIN 4.4 09/02/2020   CALCIUM 10.1 09/15/2021    Speciality Comments: Dxd by Dr. Cristal Deer Ophthalmologist-[facility] [place] 2022 per pt.  PLQ Eye Exam: 02/26/2021 WNL @ [facility]   Procedures:  No procedures performed Allergies: Contrast media [iodinated contrast media], Decongestant [pseudoephedrine hcl], Iodine, Lisinopril, Macrobid [nitrofurantoin], Metrizamide, Moxifloxacin, and Prednisone     Assessment / Plan:     Visit Diagnoses: Autoimmune disease ([place]) - dxd by Dr. Scarlette Shorts.  History of positive ANA, nasal ulcers, dry eyes and arthritis.  Treated with hydroxychloroquine 200 twice daily for 5 years: She has not had any signs or  symptoms of an autoimmune disease flare.  She has clinically been doing well taking Plaquenil 200 mg 1 tablet by mouth daily.  She is tolerating Plaquenil without any side effects and has not missed any doses recently.  She has not noted any new or worsening symptoms since reducing the dose of Plaquenil in December 2022.  Experiences joint stiffness first thing in the  morning but has no synovitis on examination today. No malar rash noted.  No photosensitivity rash recently.  No signs of alopecia.  No oral or nasal ulcerations.  Her energy level has been stable.  She continues to walk and ride her stationary bike for exercise.  She has not had any shortness of breath, pleuritic chest pain, or palpitations.  Her lungs were clear to auscultation on examination today. Lab work from 09/15/2021 was reviewed today in the office: ANA negative, double-stranded DNA negative, complements within normal limits, ESR within normal limits, no proteinuria, CBC within normal limits, CMP within normal limits, hydroxychloroquine level 780. The following lab work will be updated today.  She will remain on the current dose of Plaquenil as prescribed.  She is advised to notify us if she develops signs or symptoms of a flare.  She will follow-up in the office in 5 months or sooner if needed.  - Plan: CBC with Differential/Platelet, Protein / creatinine ratio, urine, COMPLETE METABOLIC PANEL WITH GFR, Anti-DNA antibody, double-stranded, C3 and C4, Sedimentation rate  High risk medication use - Plaquenil 200 mg 1 tablet by mouth daily.  - Plan: CBC with Differential/Platelet, COMPLETE METABOLIC PANEL WITH GFR PLQ Eye Exam: 02/26/2021 WNL @ [facility].  Advised to schedule a Plaquenil eye examination.  Patient was given a Plaquenil eye examination form to take with her to her upcoming appointment.  Primary osteoarthritis of both hands: She has PIP and DIP thickening consistent with osteoarthritis of both hands.  No tenderness or  synovitis noted.  Complete fist formation bilaterally.  Discussed the importance of joint protection and muscle strengthening.  Primary osteoarthritis of both feet: Bunions noted bilaterally, right greater than left.  PIP and DIP thickening consistent with osteoarthritis of both feet noted.  Arthropathy of lumbar facet joint: No midline spinal tenderness.  No symptoms of radiculopathy.  Vitamin D deficiency: She is taking vitamin D 50,000 units once weekly. DEXA ordered by PCP per patient.  Other medical conditions are listed as follows:  Essential hypertension  History of hyperlipidemia  History of kidney stones  History of gastroesophageal reflux (GERD)  History of Clostridioides difficile colitis  Thyroid nodule  Orders: Orders Placed This Encounter  Procedures   CBC with Differential/Platelet   Protein / creatinine ratio, urine   COMPLETE METABOLIC PANEL WITH GFR   Anti-DNA antibody, double-stranded   C3 and C4   Sedimentation rate   No orders of the defined types were placed in this encounter.    Follow-Up Instructions: Return in about 5 months (around 07/21/2022) for Autoimmune Disease, Osteoarthritis.   Ofilia Neas, PA-C  Note - This record has been created using Dragon software.  Chart creation errors have been sought, but may not always  have been located. Such creation errors do not reflect on  the standard of medical care.

## 2022-02-18 ENCOUNTER — Ambulatory Visit: Payer: Medicare Other | Attending: Physician Assistant | Admitting: Physician Assistant

## 2022-02-18 ENCOUNTER — Encounter: Payer: Self-pay | Admitting: Physician Assistant

## 2022-02-18 VITALS — BP 154/79 | HR 85 | Resp 12 | Ht 64.5 in | Wt 121.0 lb

## 2022-02-18 DIAGNOSIS — Z87442 Personal history of urinary calculi: Secondary | ICD-10-CM

## 2022-02-18 DIAGNOSIS — M47816 Spondylosis without myelopathy or radiculopathy, lumbar region: Secondary | ICD-10-CM

## 2022-02-18 DIAGNOSIS — Z8619 Personal history of other infectious and parasitic diseases: Secondary | ICD-10-CM

## 2022-02-18 DIAGNOSIS — I10 Essential (primary) hypertension: Secondary | ICD-10-CM

## 2022-02-18 DIAGNOSIS — M19041 Primary osteoarthritis, right hand: Secondary | ICD-10-CM

## 2022-02-18 DIAGNOSIS — M359 Systemic involvement of connective tissue, unspecified: Secondary | ICD-10-CM | POA: Diagnosis present

## 2022-02-18 DIAGNOSIS — Z8719 Personal history of other diseases of the digestive system: Secondary | ICD-10-CM | POA: Diagnosis present

## 2022-02-18 DIAGNOSIS — E041 Nontoxic single thyroid nodule: Secondary | ICD-10-CM | POA: Diagnosis present

## 2022-02-18 DIAGNOSIS — M19072 Primary osteoarthritis, left ankle and foot: Secondary | ICD-10-CM | POA: Diagnosis present

## 2022-02-18 DIAGNOSIS — Z79899 Other long term (current) drug therapy: Secondary | ICD-10-CM | POA: Diagnosis present

## 2022-02-18 DIAGNOSIS — E559 Vitamin D deficiency, unspecified: Secondary | ICD-10-CM

## 2022-02-18 DIAGNOSIS — M19071 Primary osteoarthritis, right ankle and foot: Secondary | ICD-10-CM

## 2022-02-18 DIAGNOSIS — Z8639 Personal history of other endocrine, nutritional and metabolic disease: Secondary | ICD-10-CM | POA: Diagnosis present

## 2022-02-18 DIAGNOSIS — M19042 Primary osteoarthritis, left hand: Secondary | ICD-10-CM | POA: Diagnosis present

## 2022-02-18 NOTE — Patient Instructions (Signed)
Hip Bursitis Rehab Ask your health care provider which exercises are safe for you. Do exercises exactly as told by your health care provider and adjust them as directed. It is normal to feel mild stretching, pulling, tightness, or discomfort as you do these exercises. Stop right away if you feel sudden pain or your pain gets worse. Do not begin these exercises until told by your health care provider. Stretching exercise This exercise warms up your muscles and joints and improves the movement and flexibility of your hip. This exercise also helps to relieve pain and stiffness. Iliotibial band stretch An iliotibial band is a strong band of muscle tissue that runs from the outer side of your hip to the outer side of your thigh and knee. Lie on your side with your left / right leg in the top position. Bend your left / right knee and grab your ankle. Stretch out your bottom arm to help you balance. Slowly bring your knee back so your thigh is slightly behind your body. Slowly lower your knee toward the floor until you feel a gentle stretch on the outside of your left / right thigh. If you do not feel a stretch and your knee will not lower more toward the floor, place the heel of your other foot on top of your knee and pull your knee down toward the floor with your foot. Hold this position for __________ seconds. Slowly return to the starting position. Repeat __________ times. Complete this exercise __________ times a day. Strengthening exercises These exercises build strength and endurance in your hip and pelvis. Endurance is the ability to use your muscles for a long time, even after they get tired. Bridge This exercise strengthens the muscles that move your thigh backward (hip extensors). Lie on your back on a firm surface with your knees bent and your feet flat on the floor. Tighten your buttocks muscles and lift your buttocks off the floor until your trunk is level with your thighs. Do not arch your  back. You should feel the muscles working in your buttocks and the back of your thighs. If you do not feel these muscles, slide your feet 1-2 inches (2.5-5 cm) farther away from your buttocks. If this exercise is too easy, try doing it with your arms crossed over your chest. Hold this position for __________ seconds. Slowly lower your hips to the starting position. Let your muscles relax completely after each repetition. Repeat __________ times. Complete this exercise __________ times a day. Squats This exercise strengthens the muscles in front of your thigh and knee (quadriceps). Stand in front of a table, with your feet and knees pointing straight ahead. You may rest your hands on the table for balance but not for support. Slowly bend your knees and lower your hips like you are going to sit in a chair. Keep your weight over your heels, not over your toes. Keep your lower legs upright so they are parallel with the table legs. Do not let your hips go lower than your knees. Do not bend lower than told by your health care provider. If your hip pain increases, do not bend as low. Hold the squat position for __________ seconds. Slowly push with your legs to return to standing. Do not use your hands to pull yourself to standing. Repeat __________ times. Complete this exercise __________ times a day. Hip hike  Stand sideways on a bottom step. Stand on your left / right leg with your other foot unsupported next to   the step. You can hold on to the railing or wall for balance if needed. Keep your knees straight and your torso square. Then lift your left / right hip up toward the ceiling. Hold this position for __________ seconds. Slowly let your left / right hip lower toward the floor, past the starting position. Your foot should get closer to the floor. Do not lean or bend your knees. Repeat __________ times. Complete this exercise __________ times a day. Single leg stand This exercise increases  your balance. Without shoes, stand near a railing or in a doorway. You may hold on to the railing or door frame as needed for balance. Squeeze your left / right buttock muscles, then lift up your other foot. Do not let your left / right hip push out to the side. It is helpful to stand in front of a mirror for this exercise so you can watch your hip. Hold this position for __________ seconds. Repeat __________ times. Complete this exercise __________ times a day.  Iliotibial Band Syndrome Rehab Ask your health care provider which exercises are safe for you. Do exercises exactly as told by your health care provider and adjust them as directed. It is normal to feel mild stretching, pulling, tightness, or discomfort as you do these exercises. Stop right away if you feel sudden pain or your pain gets significantly worse. Do not begin these exercises until told by your health care provider. Stretching and range-of-motion exercises These exercises warm up your muscles and joints and improve the movement and flexibility of your hip and pelvis. Quadriceps stretch, prone  Lie on your abdomen (prone position) on a firm surface, such as a bed or padded floor. Bend your left / right knee and reach back to hold your ankle or pant leg. If you cannot reach your ankle or pant leg, loop a belt around your foot and grab the belt instead. Gently pull your heel toward your buttocks. Your knee should not slide out to the side. You should feel a stretch in the front of your thigh and knee (quadriceps). Hold this position for __________ seconds. Repeat __________ times. Complete this exercise __________ times a day. Iliotibial band stretch An iliotibial band is a strong band of muscle tissue that runs from the outer side of your hip to the outer side of your thigh and knee. Lie on your side with your left / right leg in the top position. Bend both of your knees and grab your left / right ankle. Stretch out your bottom  arm to help you balance. Slowly bring your top knee back so your thigh goes behind your trunk. Slowly lower your top leg toward the floor until you feel a gentle stretch on the outside of your left / right hip and thigh. If you do not feel a stretch and your knee will not fall farther, place the heel of your other foot on top of your knee and pull your knee down toward the floor with your foot. Hold this position for __________ seconds. Repeat __________ times. Complete this exercise __________ times a day. Strengthening exercises These exercises build strength and endurance in your hip and pelvis. Endurance is the ability to use your muscles for a long time, even after they get tired. Straight leg raises, side-lying This exercise strengthens the muscles that rotate the leg at the hip and move it away from your body (hip abductors). Lie on your side with your left / right leg in the top position.  Lie so your head, shoulder, hip, and knee line up. You may bend your bottom knee to help you balance. Roll your hips slightly forward so your hips are stacked directly over each other and your left / right knee is facing forward. Tense the muscles in your outer thigh and lift your top leg 4-6 inches (10-15 cm). Hold this position for __________ seconds. Slowly lower your leg to return to the starting position. Let your muscles relax completely before doing another repetition. Repeat __________ times. Complete this exercise __________ times a day. Leg raises, prone This exercise strengthens the muscles that move the hips backward (hip extensors). Lie on your abdomen (prone position) on your bed or a firm surface. You can put a pillow under your hips if that is more comfortable for your lower back. Bend your left / right knee so your foot is straight up in the air. Squeeze your buttocks muscles and lift your left / right thigh off the bed. Do not let your back arch. Tense your thigh muscle as hard as you  can without increasing any knee pain. Hold this position for __________ seconds. Slowly lower your leg to return to the starting position and allow it to relax completely. Repeat __________ times. Complete this exercise __________ times a day. Hip hike Stand sideways on a bottom step. Stand on your left / right leg with your other foot unsupported next to the step. You can hold on to a railing or wall for balance if needed. Keep your knees straight and your torso square. Then lift your left / right hip up toward the ceiling. Slowly let your left / right hip lower toward the floor, past the starting position. Your foot should get closer to the floor. Do not lean or bend your knees. Repeat __________ times. Complete this exercise __________ times a day. This information is not intended to replace advice given to you by your health care provider. Make sure you discuss any questions you have with your health care provider. Document Revised: 08/08/2019 Document Reviewed: 08/08/2019 Elsevier Patient Education  [place].

## 2022-02-19 LAB — CBC WITH DIFFERENTIAL/PLATELET
Absolute Monocytes: 440 cells/uL (ref 200–950)
Basophils Absolute: 28 cells/uL (ref 0–200)
Basophils Relative: 0.5 %
Eosinophils Absolute: 88 cells/uL (ref 15–500)
Eosinophils Relative: 1.6 %
HCT: 41.9 % (ref 35.0–45.0)
Hemoglobin: 14.3 g/dL (ref 11.7–15.5)
Lymphs Abs: 1485 cells/uL (ref 850–3900)
MCH: 31.1 pg (ref 27.0–33.0)
MCHC: 34.1 g/dL (ref 32.0–36.0)
MCV: 91.1 fL (ref 80.0–100.0)
MPV: 9.8 fL (ref 7.5–12.5)
Monocytes Relative: 8 %
Neutro Abs: 3460 cells/uL (ref 1500–7800)
Neutrophils Relative %: 62.9 %
Platelets: 210 10*3/uL (ref 140–400)
RBC: 4.6 10*6/uL (ref 3.80–5.10)
RDW: 12.6 % (ref 11.0–15.0)
Total Lymphocyte: 27 %
WBC: 5.5 10*3/uL (ref 3.8–10.8)

## 2022-02-19 LAB — C3 AND C4
C3 Complement: 128 mg/dL (ref 83–193)
C4 Complement: 16 mg/dL (ref 15–57)

## 2022-02-19 LAB — COMPLETE METABOLIC PANEL WITH GFR
AG Ratio: 1.8 (calc) (ref 1.0–2.5)
ALT: 17 U/L (ref 6–29)
AST: 23 U/L (ref 10–35)
Albumin: 4.7 g/dL (ref 3.6–5.1)
Alkaline phosphatase (APISO): 69 U/L (ref 37–153)
BUN: 19 mg/dL (ref 7–25)
CO2: 29 mmol/L (ref 20–32)
Calcium: 10 mg/dL (ref 8.6–10.4)
Chloride: 104 mmol/L (ref 98–110)
Creat: 0.95 mg/dL (ref 0.60–1.00)
Globulin: 2.6 g/dL (calc) (ref 1.9–3.7)
Glucose, Bld: 87 mg/dL (ref 65–99)
Potassium: 4.9 mmol/L (ref 3.5–5.3)
Sodium: 141 mmol/L (ref 135–146)
Total Bilirubin: 0.5 mg/dL (ref 0.2–1.2)
Total Protein: 7.3 g/dL (ref 6.1–8.1)
eGFR: 64 mL/min/{1.73_m2} (ref >=60)

## 2022-02-19 LAB — PROTEIN / CREATININE RATIO, URINE
Creatinine, Urine: 36 mg/dL (ref 20–275)
Total Protein, Urine: <4 mg/dL — ABNORMAL LOW (ref 5–24)

## 2022-02-19 LAB — ANTI-DNA ANTIBODY, DOUBLE-STRANDED: ds DNA Ab: <1 IU/mL

## 2022-02-19 LAB — SEDIMENTATION RATE: Sed Rate: 2 mm/h (ref 0–30)

## 2022-02-19 NOTE — Progress Notes (Signed)
CBC and CMP WNL.  ESR and complements WNL.   No proteinuria.

## 2022-02-22 NOTE — Progress Notes (Signed)
dsDNA is negative.  Labs are not consistent with active disease or a flare.

## 2022-02-23 ENCOUNTER — Telehealth: Payer: Self-pay | Admitting: Rheumatology

## 2022-02-23 NOTE — Telephone Encounter (Signed)
Patient called the office stating her PLQ eye exam isnt until May 10th at [facility] eye center in [place]. Patient would like to know if that's ok or if she needs it sooner and where she could go to get it done sooner.

## 2022-02-23 NOTE — Telephone Encounter (Signed)
Spoke with patient and advised May 2024 would be to long of a wait. Based on her chart her last PLQ eye exam was 02/2021. Patient states she has called back to [facility] and she had an eye exam in May 2023. She states she is waiting for them to call her back to advise if she has had the visual field and the OCT. Patient advised if she has then that would be sufficient. Patient expressed understanding. Patient advised if she can get the [facility] Eye exam form to them then they could fax that form.

## 2022-03-10 ENCOUNTER — Ambulatory Visit: Payer: Medicare Other | Admitting: Physician Assistant

## 2022-03-31 ENCOUNTER — Other Ambulatory Visit: Payer: Self-pay | Admitting: Physician Assistant

## 2022-03-31 NOTE — Telephone Encounter (Signed)
Next Visit: 07/28/2022  Last Visit: 02/18/2022  Labs: 02/18/2022 CBC and CMP WNL  Eye exam: 10/20/2021 WNL    Current Dose per office note 02/18/2022: Plaquenil 200 mg 1 tablet by mouth daily  YS:[id number] disease   Last Fill: 12/28/2021  Okay to refill Plaquenil?

## 2022-04-06 NOTE — Progress Notes (Unsigned)
04/08/2022 Carol Scott [id number] 11-27-1950  Referring provider: Clinton Quant, MD Primary GI doctor: Dr. Henrene Pastor  ASSESSMENT AND PLAN:   3 months of nausea, rare vomiting, postprandial epigastric pain with history of reflux Some worsening post COVID Last endoscopy 2019 unremarkable. Some darker stools, weight loss postprandial epigastric pain, will schedule for endoscopy.   Will schedule EGD to evaluate for possible H. pylori, esophagitis, gastritis, peptic ulcer disease, etc.. I discussed risks of EGD with patient today, including risk of sedation, bleeding or perforation.  Patient provides understanding and gave verbal consent to proceed.  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 Increase Protonix to twice daily Zofran given as needed.  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 Patient continues to have abnormal imaging, most likely from diverticulum especially with normal LFTs. We will repeat LFTs today If LFTs increase can consider repeat abdominal imaging.  CIC KUB 08/2021 with large stool burden - Increase fiber/ water intake, decrease caffeine, increase activity level. -Will add on Miralax daily and Benefiber - consider linzess  History of colonic polyps Due 12/2022   History of Present Illness:  71 y.o. female  with a past medical history of hypertension, hyperlipidemia, autoimmune disease with positive ANA, Sica symptoms, arthritis on Plaquenil and others listed below, returns to clinic today for evaluation of weight loss.  01/04/2018 colonoscopy with a small sessile serrated polyp in addition to hemorrhoids, otherwise normal.  Repeat recommended in 5 years.  ( 12/2022) EGD at the same time was normal. 07/09/2020 abdominal pain/diarrhea, CT  pan colitis, GI path C. difficile.   On that CT also showed ductal dilatation. 10/09/2020 EUS with CBD measuring 6 mm and containing no stones or soft tissue masses, there was a small periampullary diverticulum about 1 cm cephalad to the major papula which was seen best in the side-viewing duodenoscope.  This may have contributed to a false positive MRCP. 08/19/2021 abdominal ultrasound for abdominal pain was unremarkable, specifically no sonographic evidence for acute cholecystitis.  08/31/2021 HIDA scan with CCK done for abdominal pain.  At that time findings were suggestive of biliary obstruction with likely choledocholithiasis.  Recommended an MRCP. 09/09/2021 office visit with Carol Scott for weight lost post COVID 04/2021 and abnormal HIDA.  She had normal thyroid, amylase, liver, kidney and and CBC.  Was complaining of left lower quadrant pain, chronic constipation, postprandial epigastric pain.  Per Dr. 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  No further imaging recommended. KUB that visit did show moderate to large stool burden which could be contributing to several of her symptoms, suggested MiraLAX purge MiraLAX daily. 02/18/2022 recently seen by rheumatology, CBC and CMET unremarkable.  No proteinuria.  Sed rate normal.  C3 and C4 complement unremarkable.  Anti-DNA negative.  Patient presents for follow-up. She states for the last 3 months every time she eats she will have nausea, with only 3 episodes of vomiting. No blood in vomit.  She will wake up in the night with nausea, one night she had upper epigastric pain radiating across with it.  Before the episode at night she is burping a lot, pepcid helps some, takes nightly but can still wake up. She is on pantoprazole daily in the AM, sometimes she will take after coffee but before food.  Denies dysphagia.  She has had darker stools than usual, she is not on pepto or iron, not tarry or  sticky.  She gets full very quickly, feels better not eating. She has AB bloating.  Had COVID November/December 2022. She has BM daily, she is on citracel and miralax alternating.  Rare aleve, once every few weeks. She will drink wine 2 x a week, on weekends, 1-2 glass very small.  She had UTI keflex 3 weeks ago, no new medications or supplements.  Mom and sister with GB removal.  States she is lost about 6 to 8 pounds since December.  Wt Readings from Last 5 Encounters:  04/08/22 122 lb 2 oz (55.4 kg)  02/18/22 121 lb (54.9 kg)  09/15/21 121 lb 12.8 oz (55.2 kg)  09/09/21 122 lb 4 oz (55.5 kg)  05/14/21 129 lb 6.4 oz (58.7 kg)     She  reports that she quit smoking about 27 years ago. Her smoking use included cigarettes. She has never been exposed to tobacco smoke. She has never used smokeless tobacco. She reports current alcohol use. She reports that she does not use drugs. Her family history includes Breast cancer in her maternal aunt, mother, and sister; Dementia in her sister; Diabetes in her father and mother; Healthy in her daughter, son, and son; Heart disease in her father; Prostate cancer in her brother and father.   Current Medications:    Current Outpatient Medications (Cardiovascular):    atorvastatin (LIPITOR) 20 MG tablet, atorvastatin 20 mg tablet  Take 1 tablet every day by oral route.   losartan (COZAAR) 100 MG tablet, Take 100 mg by mouth daily.      Current Outpatient Medications (Other):    Cholecalciferol (VITAMIN D3) 1.25 MG (50000 UT) TABS, Take 50,000 Units by mouth once a week.   famotidine (PEPCID) 40 MG tablet, Take 40 mg by mouth as needed.   hydroxychloroquine (PLAQUENIL) 200 MG tablet, TAKE 1 TABLET BY MOUTH EVERY DAY   Methylcellulose, Laxative, (CITRUCEL PO), Take 15 mLs by mouth daily as needed (Constipation). With water   ondansetron (ZOFRAN) 4 MG tablet, Take 1 tablet (4 mg total) by mouth every 8 (eight) hours as needed for nausea or  vomiting.   pantoprazole (PROTONIX) 40 MG tablet, Take 1 tablet (40 mg total) by mouth 2 (two) times daily before a meal.   Probiotic Product (ALIGN) 4 MG CAPS, Take 4 mg by mouth daily.  Surgical History:  She  has a past surgical history that includes Supracervical abdominal hysterectomy (2006); Hemorrhoid surgery (2005); Upper gastrointestinal endoscopy (01/06/2015); Tonsillectomy and adenoidectomy (1960); Colonoscopy with propofol (01-04-2018  dr Henrene Pastor); Rectal exam under anesthesia (N/A, 04/24/2020); Botox injection (N/A, 04/24/2020); EUS (N/A, 10/09/2020); and Esophagogastroduodenoscopy (egd) with propofol (N/A, 10/09/2020).  Current Medications, Allergies, Past Medical History, Past Surgical History, Family History and Social History were reviewed in [employer] record.  Physical Exam: BP 136/70   Pulse 81   Ht 5' 4.5" (1.638 m)   Wt 122 lb 2 oz (55.4 kg)   SpO2 97%   BMI 20.64 kg/m  General:   Pleasant, well developed female in no acute distress Heart : Regular rate and rhythm; no murmurs Pulm: Clear anteriorly; no wheezing Abdomen:  Soft, Flat AB, Active bowel sounds. moderate tenderness in the epigastrium and in the RUQ. Negative murphysWithout guarding and Without rebound, No organomegaly appreciated. Rectal: Not evaluated Extremities:  without  edema. Neurologic:  Alert and  oriented x4;  No focal deficits.  Psych:  Cooperative. Normal mood and affect.   Vladimir Crofts, PA-C 04/08/22

## 2022-04-08 ENCOUNTER — Ambulatory Visit (INDEPENDENT_AMBULATORY_CARE_PROVIDER_SITE_OTHER): Payer: Medicare Other | Admitting: Physician Assistant

## 2022-04-08 ENCOUNTER — Encounter: Payer: Self-pay | Admitting: Physician Assistant

## 2022-04-08 ENCOUNTER — Other Ambulatory Visit (INDEPENDENT_AMBULATORY_CARE_PROVIDER_SITE_OTHER): Payer: Medicare Other

## 2022-04-08 VITALS — BP 136/70 | HR 81 | Ht 64.5 in | Wt 122.1 lb

## 2022-04-08 DIAGNOSIS — K5900 Constipation, unspecified: Secondary | ICD-10-CM

## 2022-04-08 DIAGNOSIS — K219 Gastro-esophageal reflux disease without esophagitis: Secondary | ICD-10-CM

## 2022-04-08 DIAGNOSIS — R110 Nausea: Secondary | ICD-10-CM

## 2022-04-08 DIAGNOSIS — K838 Other specified diseases of biliary tract: Secondary | ICD-10-CM | POA: Diagnosis not present

## 2022-04-08 DIAGNOSIS — R1013 Epigastric pain: Secondary | ICD-10-CM | POA: Diagnosis not present

## 2022-04-08 DIAGNOSIS — Z86010 Personal history of colonic polyps: Secondary | ICD-10-CM

## 2022-04-08 LAB — CBC WITH DIFFERENTIAL/PLATELET
Basophils Absolute: 0.1 10*3/uL (ref 0.0–0.1)
Basophils Relative: 0.8 % (ref 0.0–3.0)
Eosinophils Absolute: 0.2 10*3/uL (ref 0.0–0.7)
Eosinophils Relative: 2.5 % (ref 0.0–5.0)
HCT: 41.3 % (ref 36.0–46.0)
Hemoglobin: 13.5 g/dL (ref 12.0–15.0)
Lymphocytes Relative: 23.7 % (ref 12.0–46.0)
Lymphs Abs: 1.6 10*3/uL (ref 0.7–4.0)
MCHC: 32.6 g/dL (ref 30.0–36.0)
MCV: 91.2 fl (ref 78.0–100.0)
Monocytes Absolute: 0.6 10*3/uL (ref 0.1–1.0)
Monocytes Relative: 8.6 % (ref 3.0–12.0)
Neutro Abs: 4.3 10*3/uL (ref 1.4–7.7)
Neutrophils Relative %: 64.4 % (ref 43.0–77.0)
Platelets: 207 10*3/uL (ref 150.0–400.0)
RBC: 4.53 Mil/uL (ref 3.87–5.11)
RDW: 14.2 % (ref 11.5–15.5)
WBC: 6.7 10*3/uL (ref 4.0–10.5)

## 2022-04-08 LAB — COMPREHENSIVE METABOLIC PANEL
ALT: 15 U/L (ref 0–35)
AST: 20 U/L (ref 0–37)
Albumin: 4.5 g/dL (ref 3.5–5.2)
Alkaline Phosphatase: 64 U/L (ref 39–117)
BUN: 21 mg/dL (ref 6–23)
CO2: 28 mEq/L (ref 19–32)
Calcium: 9.9 mg/dL (ref 8.4–10.5)
Chloride: 105 mEq/L (ref 96–112)
Creatinine, Ser: 1.1 mg/dL (ref 0.40–1.20)
GFR: 50.71 mL/min — ABNORMAL LOW (ref >60.00)
Glucose, Bld: 85 mg/dL (ref 70–99)
Potassium: 4.7 mEq/L (ref 3.5–5.1)
Sodium: 141 mEq/L (ref 135–145)
Total Bilirubin: 0.5 mg/dL (ref 0.2–1.2)
Total Protein: 7.2 g/dL (ref 6.0–8.3)

## 2022-04-08 LAB — CORTISOL: Cortisol, Plasma: 9.8 ug/dL

## 2022-04-08 LAB — LIPASE: Lipase: 44 U/L (ref 11.0–59.0)

## 2022-04-08 MED ORDER — PANTOPRAZOLE SODIUM 40 MG PO TBEC: 40.0000 mg | DELAYED_RELEASE_TABLET | Freq: Two times a day (BID) | ORAL | 0 refills | Status: DC

## 2022-04-08 MED ORDER — ONDANSETRON HCL 4 MG PO TABS: 4.0000 mg | ORAL_TABLET | Freq: Three times a day (TID) | ORAL | 1 refills | Status: DC | PRN

## 2022-04-08 NOTE — Progress Notes (Signed)
Noted  

## 2022-04-08 NOTE — Patient Instructions (Addendum)
_______________________________________________________  If you are age 71 or older, your body mass index should be between 23-30. Your Body mass index is 20.64 kg/m. If this is out of the aforementioned range listed, please consider follow up with your Primary Care Provider.  If you are age 50 or younger, your body mass index should be between 19-25. Your Body mass index is 20.64 kg/m. If this is out of the aformentioned range listed, please consider follow up with your Primary Care Provider.   ________________________________________________________  The [facility] providers would like to encourage you to use [facility] to communicate with providers for non-urgent requests or questions.  Due to long hold times on the telephone, sending your provider a message by [facility] may be a faster and more efficient way to get a response.  Please allow 48 business hours for a response.  Please remember that this is for non-urgent requests.  _______________________________________________________  Carol Scott have been scheduled for an endoscopy. Please follow written instructions given to you at your visit today. If you use inhalers (even only as needed), please bring them with you on the day of your procedure.  Your provider has requested that you go to the basement level for lab work before leaving today. Press "B" on the elevator. The lab is located at the first door on the left as you exit the elevator.   Please take your proton pump inhibitor medication, increase pantoprazole to twice a day  Please take this medication 30 minutes to 1 hour before meals- this makes it more effective.  Avoid spicy and acidic foods Avoid fatty foods Limit your intake of coffee, tea, alcohol, and carbonated drinks Work to maintain a healthy weight Keep the head of the bed elevated at least 3 inches with blocks or a wedge pillow if you are having any nighttime symptoms Stay upright for 2 hours after eating Avoid meals and  snacks three to four hours before bedtime   Miralax is an osmotic laxative.  It only brings more water into the stool.  This is safe to take daily.  Can take up to 17 gram of miralax twice a day.  Mix with juice or coffee.  Start 1 capful at night for 3-4 days and reassess your response in 3-4 days.  You can increase and decrease the dose based on your response.  Remember, it can take up to 3-4 days to take effect OR for the effects to wear off.   I often pair this with benefiber in the morning to help assure the stool is not too loose.   Gastroparesis Please do small frequent meals like 4-6 meals a day.  Eat and drink liquids at separate times.  Avoid high fiber foods, cook your vegetables, avoid high fat food.  Suggest spreading protein throughout the day (greek yogurt, glucerna, soft meat, milk, eggs) Choose soft foods that you can mash with a fork When you are more symptomatic, change to pureed foods foods and liquids.  Consider reading "Living well with Gastroparesis" by Lambert Keto Gastroparesis is a condition in which food takes longer than normal to empty from the stomach. This condition is also known as delayed gastric emptying. It is usually a long-term (chronic) condition. There is no cure, but there are treatments and things that you can do at home to help relieve symptoms. Treating the underlying condition that causes gastroparesis can also help relieve symptoms What are the causes? In many cases, the cause of this condition is not known. Possible causes  include: A hormone (endocrine) disorder, such as hypothyroidism or diabetes. A nervous system disease, such as Parkinson's disease or multiple sclerosis. Cancer, infection, or surgery that affects the stomach or vagus nerve. The vagus nerve runs from your chest, through your neck, and to the lower part of your brain. A connective tissue disorder, such as scleroderma. Certain medicines. What increases the risk? You  are more likely to develop this condition if: You have certain disorders or diseases. These may include: An endocrine disorder. An eating disorder. Amyloidosis. Scleroderma. Parkinson's disease. Multiple sclerosis. Cancer or infection of the stomach or the vagus nerve. You have had surgery on your stomach or vagus nerve. You take certain medicines. You are female. What are the signs or symptoms? Symptoms of this condition include: Feeling full after eating very little or a loss of appetite. Nausea, vomiting, or heartburn. Bloating of your abdomen. Inconsistent blood sugar (glucose) levels on blood tests. Unexplained weight loss. Acid from the stomach coming up into the esophagus (gastroesophageal reflux). Sudden tightening (spasm) of the stomach, which can be painful. Symptoms may come and go. Some people may not notice any symptoms. How is this diagnosed? This condition is diagnosed with tests, such as: Tests that check how long it takes food to move through the stomach and intestines. These tests include: Upper gastrointestinal (GI) series. For this test, you drink a liquid that shows up well on X-rays, and then X-rays are taken of your intestines. Gastric emptying scintigraphy. For this test, you eat food that contains a small amount of radioactive material, and then scans are taken. Wireless capsule GI monitoring system. For this test, you swallow a pill (capsule) that records information about how foods and fluid move through your stomach. Gastric manometry. For this test, a tube is passed down your throat and into your stomach to measure electrical and muscular activity. Endoscopy. For this test, a long, thin tube with a camera and light on the end is passed down your throat and into your stomach to check for problems in your stomach lining. Ultrasound. This test uses sound waves to create images of the inside of your body. This can help rule out gallbladder disease or  pancreatitis as a cause of your symptoms. How is this treated? There is no cure for this condition, but treatment and home care may relieve symptoms. Treatment may include: Treating the underlying cause. Managing your symptoms by making changes to your diet and exercise habits. Taking medicines to control nausea and vomiting and to stimulate stomach muscles. Getting food through a feeding tube in the hospital. This may be done in severe cases. Having surgery to insert a device called a gastric electrical stimulator into your body. This device helps improve stomach emptying and control nausea and vomiting. Follow these instructions at home: Take over-the-counter and prescription medicines only as told by your health care provider. Follow instructions from your health care provider about eating or drinking restrictions. Your health care provider may recommend that you: Eat smaller meals more often. Eat low-fat foods. Eat low-fiber forms of high-fiber foods. For example, eat cooked vegetables instead of raw vegetables. Have only liquid foods instead of solid foods. Liquid foods are easier to digest. Drink enough fluid to keep your urine pale yellow. Exercise as often as told by your health care provider. Keep all follow-up visits. This is important. Contact a health care provider if you: Notice that your symptoms do not improve with treatment. Have new symptoms. Get help right away if you:  Have severe pain in your abdomen that does not improve with treatment. Have nausea that is severe or does not go away. Vomit every time you drink fluids. Summary Gastroparesis is a long-term (chronic) condition in which food takes longer than normal to empty from the stomach. Symptoms include nausea, vomiting, heartburn, bloating of your abdomen, and loss of appetite. Eating smaller portions, low-fat foods, and low-fiber forms of high-fiber foods may help you manage your symptoms. Get help right away if  you have severe pain in your abdomen. This information is not intended to replace advice given to you by your health care provider. Make sure you discuss any questions you have with your health care provider. Document Revised: 10/08/2019 Document Reviewed: 10/08/2019 Elsevier Patient Education  2021 [employer].

## 2022-04-09 LAB — IGA: Immunoglobulin A: 153 mg/dL (ref 70–320)

## 2022-04-09 LAB — TISSUE TRANSGLUTAMINASE, IGA: (tTG) Ab, IgA: <1 U/mL

## 2022-04-13 ENCOUNTER — Encounter: Payer: Self-pay | Admitting: Internal Medicine

## 2022-04-13 ENCOUNTER — Ambulatory Visit (AMBULATORY_SURGERY_CENTER): Payer: Medicare Other | Admitting: Internal Medicine

## 2022-04-13 VITALS — BP 138/66 | HR 69 | Temp 97.5°F | Resp 10 | Ht 64.0 in | Wt 122.0 lb

## 2022-04-13 DIAGNOSIS — R1013 Epigastric pain: Secondary | ICD-10-CM | POA: Diagnosis not present

## 2022-04-13 DIAGNOSIS — R110 Nausea: Secondary | ICD-10-CM

## 2022-04-13 MED ORDER — SODIUM CHLORIDE 0.9 % IV SOLN: 500.0000 mL | Freq: Once | INTRAVENOUS | Status: DC

## 2022-04-13 NOTE — Progress Notes (Signed)
04/08/2022 Carol Scott [id number] 1950/12/20   Referring provider: Clinton Quant, MD Primary GI doctor: Dr. Henrene Pastor   ASSESSMENT AND PLAN:    3 months of nausea, rare vomiting, postprandial epigastric pain with history of reflux Some worsening post COVID Last endoscopy 2019 unremarkable. Some darker stools, weight loss postprandial epigastric pain, will schedule for endoscopy.   Will schedule EGD to evaluate for possible H. pylori, esophagitis, gastritis, peptic ulcer disease, etc.. I discussed risks of EGD with patient today, including risk of sedation, bleeding or perforation.  Patient provides understanding and gave verbal consent to proceed.   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 Increase Protonix to twice daily Zofran given as needed.   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 Patient continues to have abnormal imaging, most likely from diverticulum especially with normal LFTs. We will repeat LFTs today If LFTs increase can consider repeat abdominal imaging.   CIC KUB 08/2021 with large stool burden - Increase fiber/ water intake, decrease caffeine, increase activity level. -Will add on Miralax daily and Benefiber - consider linzess   History of colonic polyps Due 12/2022     History of Present Illness:  71 y.o. female  with a past medical history of hypertension, hyperlipidemia, autoimmune disease with positive ANA, Sica symptoms, arthritis on Plaquenil and others listed below, returns to clinic today for evaluation of weight loss.   01/04/2018 colonoscopy with a small sessile serrated polyp in addition to hemorrhoids, otherwise normal.  Repeat recommended in 5 years.  ( 12/2022) EGD at the same time was normal. 07/09/2020 abdominal pain/diarrhea,  CT pan colitis, GI path C. difficile.   On that CT also showed ductal dilatation. 10/09/2020 EUS with CBD measuring 6 mm and containing no stones or soft tissue masses, there was a small periampullary diverticulum about 1 cm cephalad to the major papula which was seen best in the side-viewing duodenoscope.  This may have contributed to a false positive MRCP. 08/19/2021 abdominal ultrasound for abdominal pain was unremarkable, specifically no sonographic evidence for acute cholecystitis.  08/31/2021 HIDA scan with CCK done for abdominal pain.  At that time findings were suggestive of biliary obstruction with likely choledocholithiasis.  Recommended an MRCP. 09/09/2021 office visit with Carol Scott for weight lost post COVID 04/2021 and abnormal HIDA.  She had normal thyroid, amylase, liver, kidney and and CBC.  Was complaining of left lower quadrant pain, chronic constipation, postprandial epigastric pain.  Per Dr. 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  No further imaging recommended. KUB that visit did show moderate to large stool burden which could be contributing to several of her symptoms, suggested MiraLAX purge MiraLAX daily. 02/18/2022 recently seen by rheumatology, CBC and CMET unremarkable.  No proteinuria.  Sed rate normal.  C3 and C4 complement unremarkable.  Anti-DNA negative.   Patient presents for follow-up. She states for the last 3 months every time she eats she will have nausea, with only 3 episodes of vomiting. No blood in vomit.  She will wake up in the night with nausea, one night she had upper epigastric pain radiating across with it.  Before the episode at night she is burping a lot, pepcid helps some, takes nightly but can still wake up. She is on pantoprazole daily in the AM, sometimes she will take  after coffee but before food.  Denies dysphagia.  She has had darker stools than usual, she is not on pepto or iron, not tarry  or sticky.  She gets full very quickly, feels better not eating. She has AB bloating.  Had COVID November/December 2022. She has BM daily, she is on citracel and miralax alternating.  Rare aleve, once every few weeks. She will drink wine 2 x a week, on weekends, 1-2 glass very small.  She had UTI keflex 3 weeks ago, no new medications or supplements.  Mom and sister with GB removal.  States she is lost about 6 to 8 pounds since December.      Wt Readings from Last 5 Encounters:  04/08/22 122 lb 2 oz (55.4 kg)  02/18/22 121 lb (54.9 kg)  09/15/21 121 lb 12.8 oz (55.2 kg)  09/09/21 122 lb 4 oz (55.5 kg)  05/14/21 129 lb 6.4 oz (58.7 kg)        She  reports that she quit smoking about 27 years ago. Her smoking use included cigarettes. She has never been exposed to tobacco smoke. She has never used smokeless tobacco. She reports current alcohol use. She reports that she does not use drugs. Her family history includes Breast cancer in her maternal aunt, mother, and sister; Dementia in her sister; Diabetes in her father and mother; Healthy in her daughter, son, and son; Heart disease in her father; Prostate cancer in her brother and father.     Current Medications:      Current Outpatient Medications (Cardiovascular):    atorvastatin (LIPITOR) 20 MG tablet, atorvastatin 20 mg tablet  Take 1 tablet every day by oral route.   losartan (COZAAR) 100 MG tablet, Take 100 mg by mouth daily.          Current Outpatient Medications (Other):    Cholecalciferol (VITAMIN D3) 1.25 MG (50000 UT) TABS, Take 50,000 Units by mouth once a week.   famotidine (PEPCID) 40 MG tablet, Take 40 mg by mouth as needed.   hydroxychloroquine (PLAQUENIL) 200 MG tablet, TAKE 1 TABLET BY MOUTH EVERY DAY   Methylcellulose, Laxative, (CITRUCEL PO), Take 15 mLs by mouth daily as needed (Constipation). With water   ondansetron (ZOFRAN) 4 MG tablet, Take 1 tablet (4 mg total) by mouth every 8 (eight) hours as needed for  nausea or vomiting.   pantoprazole (PROTONIX) 40 MG tablet, Take 1 tablet (40 mg total) by mouth 2 (two) times daily before a meal.   Probiotic Product (ALIGN) 4 MG CAPS, Take 4 mg by mouth daily.   Surgical History:  She  has a past surgical history that includes Supracervical abdominal hysterectomy (2006); Hemorrhoid surgery (2005); Upper gastrointestinal endoscopy (01/06/2015); Tonsillectomy and adenoidectomy (1960); Colonoscopy with propofol (01-04-2018  dr Henrene Pastor); Rectal exam under anesthesia (N/A, 04/24/2020); Botox injection (N/A, 04/24/2020); EUS (N/A, 10/09/2020); and Esophagogastroduodenoscopy (egd) with propofol (N/A, 10/09/2020).   Current Medications, Allergies, Past Medical History, Past Surgical History, Family History and Social History were reviewed in [employer] record.   Physical Exam: BP 136/70   Pulse 81   Ht 5' 4.5" (1.638 m)   Wt 122 lb 2 oz (55.4 kg)   SpO2 97%   BMI 20.64 kg/m  General:   Pleasant, well developed female in no acute distress Heart : Regular rate and rhythm; no murmurs Pulm: Clear anteriorly; no wheezing Abdomen:  Soft, Flat AB, Active bowel sounds. moderate tenderness in the epigastrium and in the RUQ. Negative murphysWithout guarding  and Without rebound, No organomegaly appreciated. Rectal: Not evaluated Extremities:  without  edema. Neurologic:  Alert and  oriented x4;  No focal deficits.  Psych:  Cooperative. Normal mood and affect.     Vladimir Crofts, PA-C 04/08/22

## 2022-04-13 NOTE — Progress Notes (Signed)
VS completed by DT.  Pt's states no medical or surgical changes since previsit or office visit.  

## 2022-04-13 NOTE — Progress Notes (Signed)
Pt resting comfortably. VSS. Airway intact. SBAR complete to RN. All questions answered.   

## 2022-04-13 NOTE — Op Note (Signed)
[facility] Patient Name: Carol Scott Procedure Date: 04/13/2022 3:33 PM MRN: [id number] Endoscopist: Docia Chuck. Henrene Pastor , MD, [id number] Age: 71 Referring MD:  Date of Birth: 01-14-1951 Gender: Female Account #: [id number] Procedure:                Upper GI endoscopy Indications:              Epigastric abdominal pain, Nausea Medicines:                Monitored Anesthesia Care Procedure:                Pre-Anesthesia Assessment:                           - Prior to the procedure, a History and Physical                            was performed, and patient medications and                            allergies were reviewed. The patient's tolerance of                            previous anesthesia was also reviewed. The risks                            and benefits of the procedure and the sedation                            options and risks were discussed with the patient.                            All questions were answered, and informed consent                            was obtained. Prior Anticoagulants: The patient has                            taken no anticoagulant or antiplatelet agents. ASA                            Grade Assessment: II - A patient with mild systemic                            disease. After reviewing the risks and benefits,                            the patient was deemed in satisfactory condition to                            undergo the procedure.                           After obtaining informed consent, the endoscope was  passed under direct vision. Throughout the                            procedure, the patient's blood pressure, pulse, and                            oxygen saturations were monitored continuously. The                            Endoscope was introduced through the mouth, and                            advanced to the second part of duodenum. The upper                            GI endoscopy was  accomplished without difficulty.                            The patient tolerated the procedure well. Scope In: Scope Out: Findings:                 The esophagus was normal.                           The stomach was normal.                           The examined duodenum was normal.                           The cardia and gastric fundus were normal on                            retroflexion. Complications:            No immediate complications. Estimated Blood Loss:     Estimated blood loss: none. Impression:               - Normal esophagus.                           - Normal stomach.                           - Normal examined duodenum.                           - No specimens collected. Recommendation:           - Patient has a contact number available for                            emergencies. The signs and symptoms of potential                            delayed complications were discussed with the  patient. Return to normal activities tomorrow.                            Written discharge instructions were provided to the                            patient.                           - Resume previous diet.                           - Continue present medications. Docia Chuck. Henrene Pastor, MD 04/13/2022 3:48:31 PM This report has been signed electronically.

## 2022-04-13 NOTE — Patient Instructions (Addendum)
Resume previous medications.  Resume previous diet.  YOU HAD AN ENDOSCOPIC PROCEDURE TODAY AT [facility]:   Refer to the procedure report that was given to you for any specific questions about what was found during the examination.  If the procedure report does not answer your questions, please call your gastroenterologist to clarify.  If you requested that your care partner not be given the details of your procedure findings, then the procedure report has been included in a sealed envelope for you to review at your convenience later.  YOU SHOULD EXPECT: Some feelings of bloating in the abdomen. Passage of more gas than usual.  Walking can help get rid of the air that was put into your GI tract during the procedure and reduce the bloating. If you had a lower endoscopy (such as a colonoscopy or flexible sigmoidoscopy) you may notice spotting of blood in your stool or on the toilet paper. If you underwent a bowel prep for your procedure, you may not have a normal bowel movement for a few days.  Please Note:  You might notice some irritation and congestion in your nose or some drainage.  This is from the oxygen used during your procedure.  There is no need for concern and it should clear up in a day or so.  SYMPTOMS TO REPORT IMMEDIATELY:  Following upper endoscopy (EGD)  Vomiting of blood or coffee ground material  New chest pain or pain under the shoulder blades  Painful or persistently difficult swallowing  New shortness of breath  Fever of 100F or higher  Black, tarry-looking stools  For urgent or emergent issues, a gastroenterologist can be reached at any hour by calling [phone]. Do not use MyChart messaging for urgent concerns.    DIET:  We do recommend a small meal at first, but then you may proceed to your regular diet.  Drink plenty of fluids but you should avoid alcoholic beverages for 24 hours.  ACTIVITY:  You should plan to take it easy for the rest of  today and you should NOT DRIVE or use heavy machinery until tomorrow (because of the sedation medicines used during the test).    FOLLOW UP: Our staff will call the number listed on your records the next business day following your procedure.  We will call around 7:15- 8:00 am to check on you and address any questions or concerns that you may have regarding the information given to you following your procedure. If we do not reach you, we will leave a message.     SIGNATURES/CONFIDENTIALITY: You and/or your care partner have signed paperwork which will be entered into your electronic medical record.  These signatures attest to the fact that that the information above on your After Visit Summary has been reviewed and is understood.  Full responsibility of the confidentiality of this discharge information lies with you and/or your care-partner.

## 2022-04-14 ENCOUNTER — Telehealth: Payer: Self-pay | Admitting: *Deleted

## 2022-04-14 NOTE — Telephone Encounter (Signed)
  Follow up Call-     04/13/2022    2:57 PM  Call back number  Post procedure Call Back phone  # [phone]  Permission to leave phone message Yes     Patient questions:  Do you have a fever, pain , or abdominal swelling? No. Pain Score  0 *  Have you tolerated food without any problems? Yes.    Have you been able to return to your normal activities? Yes.    Do you have any questions about your discharge instructions: Diet   No. Medications  No. Follow up visit  No.  Do you have questions or concerns about your Care? No.  Actions: * If pain score is 4 or above: No action needed, pain <4.

## 2022-05-01 ENCOUNTER — Other Ambulatory Visit: Payer: Self-pay | Admitting: Physician Assistant

## 2022-05-01 DIAGNOSIS — R110 Nausea: Secondary | ICD-10-CM

## 2022-05-01 DIAGNOSIS — R1013 Epigastric pain: Secondary | ICD-10-CM

## 2022-07-14 NOTE — Progress Notes (Deleted)
Office Visit Note  Patient: Carol Scott             Date of Birth: 1950/08/29           MRN: [id number]             PCP: Carol Quant, MD Referring: Carol Quant, MD Visit Date: 07/28/2022 Occupation: '@GUAROCC'$ @  Subjective:  No chief complaint on file.   History of Present Illness: Carol Scott is a 72 y.o. female ***     Activities of Daily Living:  Patient reports morning stiffness for *** {minute/hour:19697}.   Patient {ACTIONS;DENIES/REPORTS:21021675::"Denies"} nocturnal pain.  Difficulty dressing/grooming: {ACTIONS;DENIES/REPORTS:21021675::"Denies"} Difficulty climbing stairs: {ACTIONS;DENIES/REPORTS:21021675::"Denies"} Difficulty getting out of chair: {ACTIONS;DENIES/REPORTS:21021675::"Denies"} Difficulty using hands for taps, buttons, cutlery, and/or writing: {ACTIONS;DENIES/REPORTS:21021675::"Denies"}  No Rheumatology ROS completed.   PMFS History:  Patient Active Problem List   Diagnosis Date Noted   Autoimmune disease ([place]) 05/14/2021   High risk medication use 05/14/2021   Primary osteoarthritis of both hands 05/14/2021   Primary osteoarthritis of both feet 05/14/2021   Arthropathy of lumbar facet joint 05/14/2021   Vitamin D deficiency 05/14/2021   Essential hypertension 05/14/2021   History of hyperlipidemia 05/14/2021   History of gastroesophageal reflux (GERD) 05/14/2021   History of kidney stones 05/14/2021   Diarrhea 07/14/2020   Infectious colitis 07/11/2020   Common bile duct dilation 07/11/2020   Nausea without vomiting 01/01/2015   Abdominal pain, epigastric 01/01/2015    Past Medical History:  Diagnosis Date   Anal fissure    Anxiety    Cervical spondylosis without myelopathy    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 angia at rest, sob & r/o afib;  had 72 hour monitor (per note 04-07-2020 SR -- ST biphasic p waves no evidence afib/ flutter),  echo 03-28-2020 normal w/ ef >55%, and nuclear stress test 03-04-2020 borderline abnormal notable for poor uptake during rest , normal perfusion, and no evidence ischemia, ef 66%    Hypertension    followed by pcp   Mitral valve prolapse    Multiple thyroid nodules    04-22-2020  per pt has had previous bx's which were benign , followed by pcp   OA (osteoarthritis)    shoudlers, knees   Perianal pain    PONV (postoperative nausea and vomiting)    Vitamin D deficiency    Wears glasses     Family History  Problem Relation Age of Onset   Breast cancer Mother    Diabetes Mother    Prostate cancer Father    Diabetes Father    Heart disease Father    Breast cancer Sister    Dementia Sister    Prostate cancer Brother    Breast cancer Maternal Aunt    Healthy Son    Healthy Son    Healthy Daughter    Colon cancer Neg Hx    Esophageal cancer Neg Hx  Rectal cancer Neg Hx    Stomach cancer Neg Hx    Past Surgical History:  Procedure Laterality Date   BOTOX INJECTION N/A 04/24/2020   Procedure: POSSIBLE BOTOX INJECTION;  Surgeon: Ileana Roup, MD;  Location: [facility];  Service: General;  Laterality: N/A;   COLONOSCOPY WITH PROPOFOL  01-04-2018  dr Henrene Pastor   ESOPHAGOGASTRODUODENOSCOPY (EGD) WITH PROPOFOL N/A 10/09/2020   Procedure: ESOPHAGOGASTRODUODENOSCOPY (EGD) WITH PROPOFOL;  Surgeon: Milus Banister, MD;  Location: [facility];  Service: Endoscopy;  Laterality: N/A;   EUS N/A  10/09/2020   Procedure: UPPER ENDOSCOPIC ULTRASOUND (EUS) RADIAL;  Surgeon: Milus Banister, MD;  Location: [facility];  Service: Endoscopy;  Laterality: N/A;   HEMORRHOID SURGERY  2005   RECTAL EXAM UNDER ANESTHESIA N/A 04/24/2020   Procedure: ANORECTAL EXAM UNDER ANESTHESIA, EXCISION OF PERIANAL LESION; BOTOX INJECTION;  Surgeon: Ileana Roup, MD;  Location: [facility];  Service: General;  Laterality: N/A;   SUPRACERVICAL ABDOMINAL HYSTERECTOMY  2006   WITH BILATERAL SALPINGOOPHORECTOMY AND PELVIC FLOOR REPAIR (possible inculding sling)   TONSILLECTOMY AND ADENOIDECTOMY  1960   UPPER GASTROINTESTINAL ENDOSCOPY  01/06/2015   Social History   Social History Narrative   Not on file   Immunization History  Administered Date(s) Administered   Moderna Sars-Covid-2 Vaccination 08/17/2019, 09/14/2019, 05/16/2020     Objective: Vital Signs: There were no vitals taken for this visit.   Physical Exam   Musculoskeletal Exam: ***  CDAI Exam: CDAI Score: -- Patient Global: --; Provider Global: -- Swollen: --; Tender: -- Joint Exam 07/28/2022   No joint exam has been documented for this visit   There is currently no information documented on the homunculus. Go to the Rheumatology activity and complete the homunculus joint exam.  Investigation: No additional findings.  Imaging: No results found.  Recent Labs: Lab Results  Component Value Date   WBC 6.7 04/08/2022   HGB 13.5 04/08/2022   PLT 207.0 04/08/2022   NA 141 04/08/2022   K 4.7 04/08/2022   CL 105 04/08/2022   CO2 28 04/08/2022   GLUCOSE 85 04/08/2022   BUN 21 04/08/2022   CREATININE 1.10 04/08/2022   BILITOT 0.5 04/08/2022   ALKPHOS 64 04/08/2022   AST 20 04/08/2022   ALT 15 04/08/2022   PROT 7.2 04/08/2022   ALBUMIN 4.5 04/08/2022   CALCIUM 9.9 04/08/2022    Speciality Comments: Dxd by Dr. Cristal Deer Ophthalmologist-[facility] [place] 2022 per pt.  PLQ Eye Exam: 10/20/2021 WNL  @ [facility] [place] Followup in 1 year  Procedures:  No procedures performed Allergies: Contrast media [iodinated contrast media], Decongestant [pseudoephedrine hcl], Iodine, Lisinopril, Macrobid [nitrofurantoin], Metrizamide, Moxifloxacin, and Prednisone   Assessment / Plan:     Visit Diagnoses: No diagnosis found.  Orders: No orders of the defined types were placed in this encounter.  No orders of the defined types were placed in this encounter.   Face-to-face time spent with patient was *** minutes. Greater than 50% of time was spent in counseling and coordination of care.  Follow-Up Instructions: No follow-ups on file.   Earnestine Mealing, CMA  Note - This record has been created using Editor, commissioning.  Chart creation errors have been sought, but may not always  have been located. Such creation errors do not reflect on  the standard of medical care.

## 2022-07-28 ENCOUNTER — Ambulatory Visit: Payer: Medicare Other | Admitting: Rheumatology

## 2022-07-28 DIAGNOSIS — M47816 Spondylosis without myelopathy or radiculopathy, lumbar region: Secondary | ICD-10-CM

## 2022-07-28 DIAGNOSIS — Z8719 Personal history of other diseases of the digestive system: Secondary | ICD-10-CM

## 2022-07-28 DIAGNOSIS — Z87442 Personal history of urinary calculi: Secondary | ICD-10-CM

## 2022-07-28 DIAGNOSIS — M19071 Primary osteoarthritis, right ankle and foot: Secondary | ICD-10-CM

## 2022-07-28 DIAGNOSIS — I10 Essential (primary) hypertension: Secondary | ICD-10-CM

## 2022-07-28 DIAGNOSIS — E559 Vitamin D deficiency, unspecified: Secondary | ICD-10-CM

## 2022-07-28 DIAGNOSIS — Z8639 Personal history of other endocrine, nutritional and metabolic disease: Secondary | ICD-10-CM

## 2022-07-28 DIAGNOSIS — Z8619 Personal history of other infectious and parasitic diseases: Secondary | ICD-10-CM

## 2022-07-28 DIAGNOSIS — M19041 Primary osteoarthritis, right hand: Secondary | ICD-10-CM

## 2022-07-28 DIAGNOSIS — Z79899 Other long term (current) drug therapy: Secondary | ICD-10-CM

## 2022-07-28 DIAGNOSIS — E041 Nontoxic single thyroid nodule: Secondary | ICD-10-CM

## 2022-07-28 DIAGNOSIS — M359 Systemic involvement of connective tissue, unspecified: Secondary | ICD-10-CM

## 2022-08-03 ENCOUNTER — Other Ambulatory Visit: Payer: Self-pay | Admitting: Rheumatology

## 2022-08-03 MED ORDER — HYDROXYCHLOROQUINE SULFATE 200 MG PO TABS: 200.0000 mg | ORAL_TABLET | Freq: Every day | ORAL | 0 refills | Status: DC

## 2022-08-03 NOTE — Telephone Encounter (Signed)
Next Visit: 09/23/2021   Last Visit: 02/18/2022   Labs: 04/08/2022 GFR 50.71   Eye exam: 10/20/2021 WNL     Current Dose per office note 02/18/2022: Plaquenil 200 mg 1 tablet by mouth daily   [id number] disease    Last Fill: 03/31/2022   Okay to refill Plaquenil?

## 2022-08-03 NOTE — Telephone Encounter (Signed)
Patient called the office requesting a refill of Plaquenil 265m to be sent to [facility] in [place]

## 2022-09-12 NOTE — Progress Notes (Signed)
Office Visit Note  Patient: Carol Scott             Date of Birth: 09/21/1950           MRN: [id number]             PCP: Eldridge Abrahams, MD Referring: Eldridge Abrahams, MD Visit Date: 09/24/2022 Occupation: @GUAROCC @  Subjective:  Pain in both hands x 3 weeks   History of Present Illness: Carol Scott is a 72 y.o. female with history of autoimmune disease.  She states she has been having pain and stiffness in her hands which she describes mostly in the DIP joints.  She denies trigger finger.  She states she has been experiencing lower back pain and has been going to physical therapy which has been helpful.  She was also taking meloxicam which helps with the lower back pain.  She does not have much discomfort in her feet.  She denies any history of oral ulcers, nasal ulcers, malar rash,  Raynaud's or lymphadenopathy.  She gives history of dry mouth and photosensitivity.    Activities of Daily Living:  Patient reports morning stiffness for 10-20 minutes.   Patient Denies nocturnal pain.  Difficulty dressing/grooming: Denies Difficulty climbing stairs: Denies Difficulty getting out of chair: Denies Difficulty using hands for taps, buttons, cutlery, and/or writing: Denies  Review of Systems  Constitutional:  Negative for fatigue.  HENT:  Positive for mouth dryness. Negative for mouth sores.   Eyes:  Negative for dryness.  Respiratory:  Negative for shortness of breath.   Cardiovascular:  Negative for chest pain and palpitations.  Gastrointestinal:  Negative for blood in stool, constipation and diarrhea.  Endocrine: Negative for increased urination.  Genitourinary:  Negative for involuntary urination.  Musculoskeletal:  Positive for joint pain, joint pain, joint swelling, myalgias, morning stiffness, muscle tenderness and myalgias. Negative for gait problem and muscle weakness.  Skin:  Positive for sensitivity to sunlight. Negative for color change, rash and hair loss.   Allergic/Immunologic: Negative for susceptible to infections.  Neurological:  Positive for dizziness. Negative for headaches.  Hematological:  Negative for swollen glands.  Psychiatric/Behavioral:  Negative for depressed mood and sleep disturbance. The patient is not nervous/anxious.     PMFS History:  Patient Active Problem List   Diagnosis Date Noted   Autoimmune disease 05/14/2021   High risk medication use 05/14/2021   Primary osteoarthritis of both hands 05/14/2021   Primary osteoarthritis of both feet 05/14/2021   Arthropathy of lumbar facet joint 05/14/2021   Vitamin D deficiency 05/14/2021   Essential hypertension 05/14/2021   History of hyperlipidemia 05/14/2021   History of gastroesophageal reflux (GERD) 05/14/2021   History of kidney stones 05/14/2021   Diarrhea 07/14/2020   Infectious colitis 07/11/2020   Common bile duct dilation 07/11/2020   Nausea without vomiting 01/01/2015   Abdominal pain, epigastric 01/01/2015    Past Medical History:  Diagnosis Date   Anal fissure    Anxiety    Cervical spondylosis without myelopathy    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 angia at rest, sob & r/o afib;  had 72 hour monitor (per note 04-07-2020 SR -- ST biphasic p waves no evidence afib/ flutter),  echo 03-28-2020 normal w/ ef >55%, and nuclear stress test 03-04-2020 borderline abnormal notable for poor uptake during rest , normal  perfusion, and no evidence ischemia, ef 66%    Hypertension    followed by pcp   Mitral valve prolapse    Multiple thyroid nodules    04-22-2020  per pt has had previous bx's which were benign , followed by pcp   OA (osteoarthritis)    shoudlers, knees   Perianal pain    PONV (postoperative nausea and vomiting)    Vitamin D deficiency    Wears glasses     Family History  Problem Relation Age of Onset   Breast cancer Mother    Diabetes Mother    Prostate cancer Father    Diabetes Father    Heart disease Father    Breast cancer Sister    Dementia Sister    Prostate cancer Brother    Breast cancer Maternal Aunt    Healthy Son    Healthy Son    Healthy Daughter    Colon cancer Neg Hx    Esophageal cancer Neg Hx    Rectal cancer Neg Hx    Stomach cancer Neg Hx    Past Surgical History:  Procedure Laterality Date   BOTOX INJECTION N/A 04/24/2020   Procedure: POSSIBLE BOTOX INJECTION;  Surgeon: Andria Meuse, MD;  Location: [facility];  Service: General;  Laterality: N/A;   COLONOSCOPY WITH PROPOFOL  01-04-2018  dr Marina Goodell   ESOPHAGOGASTRODUODENOSCOPY (EGD) WITH PROPOFOL N/A 10/09/2020   Procedure: ESOPHAGOGASTRODUODENOSCOPY (EGD) WITH PROPOFOL;  Surgeon: Rachael Fee, MD;  Location: [facility];  Service: Endoscopy;  Laterality: N/A;   EUS N/A 10/09/2020   Procedure: UPPER ENDOSCOPIC ULTRASOUND (EUS) RADIAL;  Surgeon: Rachael Fee, MD;  Location: [facility];  Service: Endoscopy;  Laterality: N/A;   HEMORRHOID SURGERY  2005   RECTAL EXAM UNDER ANESTHESIA N/A 04/24/2020   Procedure: ANORECTAL EXAM UNDER ANESTHESIA, EXCISION OF PERIANAL LESION; BOTOX INJECTION;  Surgeon: Andria Meuse, MD;  Location: [facility];  Service: General;  Laterality: N/A;   SUPRACERVICAL ABDOMINAL HYSTERECTOMY  2006   WITH BILATERAL SALPINGOOPHORECTOMY AND PELVIC FLOOR REPAIR (possible inculding sling)   TONSILLECTOMY AND ADENOIDECTOMY  1960   UPPER  GASTROINTESTINAL ENDOSCOPY  01/06/2015   Social History   Social History Narrative   Not on file   Immunization History  Administered Date(s) Administered   Moderna Sars-Covid-2 Vaccination 08/17/2019, 09/14/2019, 05/16/2020     Objective: Vital Signs: BP 139/78 (BP Location: Left Arm, Patient Position: Sitting, Cuff Size: Normal)   Pulse 72   Ht 5\' 4"  (1.626 m)   Wt 127 lb (57.6 kg)   BMI 21.80 kg/m    Physical Exam Vitals and nursing note reviewed.  Constitutional:      Appearance: She is well-developed.  HENT:     Head: Normocephalic and atraumatic.  Eyes:     Conjunctiva/sclera: Conjunctivae normal.  Cardiovascular:     Rate and Rhythm: Normal rate and regular rhythm.     Heart sounds: Normal heart sounds.  Pulmonary:     Effort: Pulmonary effort is normal.     Breath sounds: Normal  breath sounds.  Abdominal:     General: Bowel sounds are normal.     Palpations: Abdomen is soft.  Musculoskeletal:     Cervical back: Normal range of motion.  Lymphadenopathy:     Cervical: No cervical adenopathy.  Skin:    General: Skin is warm and dry.     Capillary Refill: Capillary refill takes less than 2 seconds.  Neurological:     Mental Status: She is alert and oriented to person, place, and time.  Psychiatric:        Behavior: Behavior normal.      Musculoskeletal Exam: Cervical spine was in good range of motion.  Shoulder joints, elbow joints, wrist joints, MCPs PIPs and DIPs were in good range of motion.  She had bilateral DIP thickening with no synovitis.  Mild Dupuytren's contracture was noted in the right hand fourth flexor tendon.  Hip joints and knee joints were in good range of motion.  No warmth swelling or effusion was noted.  There was no tenderness over ankles or MTPs.  She had bilateral bunions and PIP and DIP thickening consistent with osteoarthritis.  CDAI Exam: CDAI Score: -- Patient Global: --; Provider Global: -- Swollen: --; Tender: -- Joint Exam  09/24/2022   No joint exam has been documented for this visit   There is currently no information documented on the homunculus. Go to the Rheumatology activity and complete the homunculus joint exam.  Investigation: No additional findings.  Imaging: No results found.  Recent Labs: Lab Results  Component Value Date   WBC 6.7 04/08/2022   HGB 13.5 04/08/2022   PLT 207.0 04/08/2022   NA 141 04/08/2022   K 4.7 04/08/2022   CL 105 04/08/2022   CO2 28 04/08/2022   GLUCOSE 85 04/08/2022   BUN 21 04/08/2022   CREATININE 1.10 04/08/2022   BILITOT 0.5 04/08/2022   ALKPHOS 64 04/08/2022   AST 20 04/08/2022   ALT 15 04/08/2022   PROT 7.2 04/08/2022   ALBUMIN 4.5 04/08/2022   CALCIUM 9.9 04/08/2022    Speciality Comments: Dxd by Dr. Dorathy Kinsman Ophthalmologist-[facility] [place] 2022 per pt.PLQ since 2017  PLQ Eye Exam: 10/20/2021 WNL @ [facility] [place] Followup in 1 year  Procedures:  No procedures performed Allergies: Contrast media [iodinated contrast media], Decongestant [pseudoephedrine hcl], Iodine, Lisinopril, Macrobid [nitrofurantoin], Metrizamide, Moxifloxacin, and Prednisone   Assessment / Plan:     Visit Diagnoses: Autoimmune disease - dxd by Dr. Octaviano Glow.  History of positive ANA, nasal ulcers, dry eyes and arthritis.  Treated with hydroxychloroquine 200 twice daily since 2017: -Patient denies any history of oral ulcers, nasal ulcers, malar rash, lymphadenopathy or inflammatory arthritis.  She gives history of sicca symptoms and photosensitivity.  Her labs have been stable.  Will check labs today.  Plan: Protein / creatinine ratio, urine, Anti-DNA antibody, double-stranded, C3 and C4, Sedimentation rate, ANA  High risk medication use - Plaquenil 200 mg 1 tablet by mouth daily.  Plaquenil eye examination Oct [facility], [facility] -annual eye examination to monitor for ocular toxicity was advised.  Will check labs today.  Plan: CBC with  Differential/Platelet, COMPLETE METABOLIC PANEL WITH GFR.  Information on immunization was placed in the AVS.  Primary osteoarthritis of both hands-she has been experiencing discomfort in her bilateral hands over the DIP joints.  Clinical findings are consistent with osteoarthritis.  Joint protection muscle strengthening was discussed.  Dupuytren's contracture of right hand-mild Dupuytren's contracture was noted in  the right fourth flexor tendon which is asymptomatic.  Primary osteoarthritis of both feet-she had bilateral bone hands and osteoarthritis.  She denies any discomfort in her feet.  Arthropathy of lumbar facet joint-she has been experience increased lower back pain.  She is going to physical therapy which is helpful.  She recently started taking meloxicam.  Side effects of meloxicam including reflux, renal and hepatotoxicity were discussed.  Vitamin D deficiency - DEXA scan is followed by her PCP. -I do not have recent DEXA results.  Patient states she has been taking vitamin D 50,000 units once a week for many years.  I will check vitamin D level today.  Patient requested a refill on vitamin D.  I will Ro suggest dosing after the results are available.  Plan: VITAMIN D 25 Hydroxy (Vit-D Deficiency, Fractures)  Other medical problems are listed as follows:  History of kidney stones  Essential hypertension-blood pressure was normal at 139/78.  History of hyperlipidemia  History of gastroesophageal reflux (GERD)  History of Clostridioides difficile colitis  Thyroid nodule  Orders: Orders Placed This Encounter  Procedures   Protein / creatinine ratio, urine   Anti-DNA antibody, double-stranded   C3 and C4   Sedimentation rate   CBC with Differential/Platelet   COMPLETE METABOLIC PANEL WITH GFR   VITAMIN D 25 Hydroxy (Vit-D Deficiency, Fractures)   ANA   No orders of the defined types were placed in this encounter.    Follow-Up Instructions: Return in about 5 months  (around 02/24/2023) for Autoimmune disease.   Pollyann Savoy, MD  Note - This record has been created using Animal nutritionist.  Chart creation errors have been sought, but may not always  have been located. Such creation errors do not reflect on  the standard of medical care.

## 2022-09-24 ENCOUNTER — Ambulatory Visit: Payer: Medicare Other | Attending: Rheumatology | Admitting: Rheumatology

## 2022-09-24 ENCOUNTER — Encounter: Payer: Self-pay | Admitting: Rheumatology

## 2022-09-24 VITALS — BP 139/78 | HR 72 | Ht 64.0 in | Wt 127.0 lb

## 2022-09-24 DIAGNOSIS — M47816 Spondylosis without myelopathy or radiculopathy, lumbar region: Secondary | ICD-10-CM | POA: Diagnosis present

## 2022-09-24 DIAGNOSIS — Z87442 Personal history of urinary calculi: Secondary | ICD-10-CM | POA: Insufficient documentation

## 2022-09-24 DIAGNOSIS — M19072 Primary osteoarthritis, left ankle and foot: Secondary | ICD-10-CM

## 2022-09-24 DIAGNOSIS — E041 Nontoxic single thyroid nodule: Secondary | ICD-10-CM | POA: Diagnosis present

## 2022-09-24 DIAGNOSIS — E559 Vitamin D deficiency, unspecified: Secondary | ICD-10-CM | POA: Diagnosis present

## 2022-09-24 DIAGNOSIS — M791 Myalgia, unspecified site: Secondary | ICD-10-CM

## 2022-09-24 DIAGNOSIS — Z79899 Other long term (current) drug therapy: Secondary | ICD-10-CM | POA: Diagnosis present

## 2022-09-24 DIAGNOSIS — M19041 Primary osteoarthritis, right hand: Secondary | ICD-10-CM | POA: Diagnosis present

## 2022-09-24 DIAGNOSIS — I10 Essential (primary) hypertension: Secondary | ICD-10-CM | POA: Diagnosis present

## 2022-09-24 DIAGNOSIS — M19042 Primary osteoarthritis, left hand: Secondary | ICD-10-CM | POA: Diagnosis present

## 2022-09-24 DIAGNOSIS — M359 Systemic involvement of connective tissue, unspecified: Secondary | ICD-10-CM | POA: Diagnosis present

## 2022-09-24 DIAGNOSIS — Z8719 Personal history of other diseases of the digestive system: Secondary | ICD-10-CM | POA: Insufficient documentation

## 2022-09-24 DIAGNOSIS — M720 Palmar fascial fibromatosis [Dupuytren]: Secondary | ICD-10-CM | POA: Insufficient documentation

## 2022-09-24 DIAGNOSIS — Z8639 Personal history of other endocrine, nutritional and metabolic disease: Secondary | ICD-10-CM

## 2022-09-24 DIAGNOSIS — M19071 Primary osteoarthritis, right ankle and foot: Secondary | ICD-10-CM

## 2022-09-24 DIAGNOSIS — Z8619 Personal history of other infectious and parasitic diseases: Secondary | ICD-10-CM | POA: Diagnosis present

## 2022-09-24 NOTE — Patient Instructions (Signed)
Vaccines You are taking a medication(s) that can suppress your immune system.  The following immunizations are recommended: Flu annually Covid-19  Td/Tdap (tetanus, diphtheria, pertussis) every 10 years Pneumonia (Prevnar 15 then Pneumovax 23 at least 1 year apart.  Alternatively, can take Prevnar 20 without needing additional dose) Shingrix: 2 doses from 4 weeks to 6 months apart  Please check with your PCP to make sure you are up to date.   Hand Exercises Hand exercises can be helpful for almost anyone. These exercises can strengthen the hands, improve flexibility and movement, and increase blood flow to the hands. These results can make work and daily tasks easier. Hand exercises can be especially helpful for people who have joint pain from arthritis or have nerve damage from overuse (carpal tunnel syndrome). These exercises can also help people who have injured a hand. Exercises Most of these hand exercises are gentle stretching and motion exercises. It is usually safe to do them often throughout the day. Warming up your hands before exercise may help to reduce stiffness. You can do this with gentle massage or by placing your hands in warm water for 10-15 minutes. It is normal to feel some stretching, pulling, tightness, or mild discomfort as you begin new exercises. This will gradually improve. Stop an exercise right away if you feel sudden, severe pain or your pain gets worse. Ask your health care provider which exercises are best for you. Knuckle bend or "claw" fist  Stand or sit with your arm, hand, and all five fingers pointed straight up. Make sure to keep your wrist straight during the exercise. Gently bend your fingers down toward your palm until the tips of your fingers are touching the top of your palm. Keep your big knuckle straight and just bend the small knuckles in your fingers. Hold this position for __________ seconds. Straighten (extend) your fingers back to the starting  position. Repeat this exercise 5-10 times with each hand. Full finger fist  Stand or sit with your arm, hand, and all five fingers pointed straight up. Make sure to keep your wrist straight during the exercise. Gently bend your fingers into your palm until the tips of your fingers are touching the middle of your palm. Hold this position for __________ seconds. Extend your fingers back to the starting position, stretching every joint fully. Repeat this exercise 5-10 times with each hand. Straight fist Stand or sit with your arm, hand, and all five fingers pointed straight up. Make sure to keep your wrist straight during the exercise. Gently bend your fingers at the big knuckle, where your fingers meet your hand, and the middle knuckle. Keep the knuckle at the tips of your fingers straight and try to touch the bottom of your palm. Hold this position for __________ seconds. Extend your fingers back to the starting position, stretching every joint fully. Repeat this exercise 5-10 times with each hand. Tabletop  Stand or sit with your arm, hand, and all five fingers pointed straight up. Make sure to keep your wrist straight during the exercise. Gently bend your fingers at the big knuckle, where your fingers meet your hand, as far down as you can while keeping the small knuckles in your fingers straight. Think of forming a tabletop with your fingers. Hold this position for __________ seconds. Extend your fingers back to the starting position, stretching every joint fully. Repeat this exercise 5-10 times with each hand. Finger spread  Place your hand flat on a table with your palm  facing down. Make sure your wrist stays straight as you do this exercise. Spread your fingers and thumb apart from each other as far as you can until you feel a gentle stretch. Hold this position for __________ seconds. Bring your fingers and thumb tight together again. Hold this position for __________ seconds. Repeat  this exercise 5-10 times with each hand. Making circles  Stand or sit with your arm, hand, and all five fingers pointed straight up. Make sure to keep your wrist straight during the exercise. Make a circle by touching the tip of your thumb to the tip of your index finger. Hold for __________ seconds. Then open your hand wide. Repeat this motion with your thumb and each finger on your hand. Repeat this exercise 5-10 times with each hand. Thumb motion  Sit with your forearm resting on a table and your wrist straight. Your thumb should be facing up toward the ceiling. Keep your fingers relaxed as you move your thumb. Lift your thumb up as high as you can toward the ceiling. Hold for __________ seconds. Bend your thumb across your palm as far as you can, reaching the tip of your thumb for the small finger (pinkie) side of your palm. Hold for __________ seconds. Repeat this exercise 5-10 times with each hand. Grip strengthening  Hold a stress ball or other soft ball in the middle of your hand. Slowly increase the pressure, squeezing the ball as much as you can without causing pain. Think of bringing the tips of your fingers into the middle of your palm. All of your finger joints should bend when doing this exercise. Hold your squeeze for __________ seconds, then relax. Repeat this exercise 5-10 times with each hand. Contact a health care provider if: Your hand pain or discomfort gets much worse when you do an exercise. Your hand pain or discomfort does not improve within 2 hours after you exercise. If you have any of these problems, stop doing these exercises right away. Do not do them again unless your health care provider says that you can. Get help right away if: You develop sudden, severe hand pain or swelling. If this happens, stop doing these exercises right away. Do not do them again unless your health care provider says that you can. This information is not intended to replace advice  given to you by your health care provider. Make sure you discuss any questions you have with your health care provider. Document Revised: 09/11/2020 Document Reviewed: 09/18/2020 Elsevier Patient Education  2023 [employer].

## 2022-09-25 LAB — COMPLETE METABOLIC PANEL WITH GFR
AG Ratio: 1.6 (calc) (ref 1.0–2.5)
ALT: 14 U/L (ref 6–29)
AST: 21 U/L (ref 10–35)
CO2: 29 mmol/L (ref 20–32)
Creat: 0.93 mg/dL (ref 0.60–1.00)
Glucose, Bld: 87 mg/dL (ref 65–99)
Potassium: 4.6 mmol/L (ref 3.5–5.3)
Sodium: 139 mmol/L (ref 135–146)
Total Protein: 7.1 g/dL (ref 6.1–8.1)

## 2022-09-25 LAB — CBC WITH DIFFERENTIAL/PLATELET
Basophils Absolute: 39 cells/uL (ref 0–200)
Eosinophils Absolute: 78 cells/uL (ref 15–500)
Lymphs Abs: 1322 cells/uL (ref 850–3900)
MPV: 10 fL (ref 7.5–12.5)
Monocytes Relative: 9.2 %
RBC: 4.58 10*6/uL (ref 3.80–5.10)
WBC: 5.6 10*3/uL (ref 3.8–10.8)

## 2022-09-25 LAB — PROTEIN / CREATININE RATIO, URINE: Total Protein, Urine: <4 mg/dL — ABNORMAL LOW (ref 5–24)

## 2022-09-25 LAB — C3 AND C4: C3 Complement: 122 mg/dL (ref 83–193)

## 2022-09-26 LAB — CBC WITH DIFFERENTIAL/PLATELET
Absolute Monocytes: 515 cells/uL (ref 200–950)
Basophils Relative: 0.7 %
Eosinophils Relative: 1.4 %
HCT: 40.8 % (ref 35.0–45.0)
Hemoglobin: 13.2 g/dL (ref 11.7–15.5)
MCH: 28.8 pg (ref 27.0–33.0)
MCHC: 32.4 g/dL (ref 32.0–36.0)
MCV: 89.1 fL (ref 80.0–100.0)
Neutro Abs: 3646 cells/uL (ref 1500–7800)
Neutrophils Relative %: 65.1 %
Platelets: 231 10*3/uL (ref 140–400)
RDW: 13.1 % (ref 11.0–15.0)
Total Lymphocyte: 23.6 %

## 2022-09-26 LAB — COMPLETE METABOLIC PANEL WITH GFR
Albumin: 4.4 g/dL (ref 3.6–5.1)
Alkaline phosphatase (APISO): 75 U/L (ref 37–153)
BUN: 24 mg/dL (ref 7–25)
Calcium: 9.7 mg/dL (ref 8.6–10.4)
Chloride: 102 mmol/L (ref 98–110)
Globulin: 2.7 g/dL (calc) (ref 1.9–3.7)
Total Bilirubin: 0.6 mg/dL (ref 0.2–1.2)
eGFR: 66 mL/min/{1.73_m2} (ref >=60)

## 2022-09-26 LAB — ANTI-NUCLEAR AB-TITER (ANA TITER): ANA Titer 1: 1:80 {titer} — ABNORMAL HIGH

## 2022-09-26 LAB — PROTEIN / CREATININE RATIO, URINE: Creatinine, Urine: 30 mg/dL (ref 20–275)

## 2022-09-26 LAB — SEDIMENTATION RATE: Sed Rate: 2 mm/h (ref 0–30)

## 2022-09-26 LAB — VITAMIN D 25 HYDROXY (VIT D DEFICIENCY, FRACTURES): Vit D, 25-Hydroxy: 58 ng/mL (ref 30–100)

## 2022-09-26 LAB — C3 AND C4: C4 Complement: 15 mg/dL (ref 15–57)

## 2022-09-26 LAB — ANA: Anti Nuclear Antibody (ANA): POSITIVE — AB

## 2022-09-26 LAB — ANTI-DNA ANTIBODY, DOUBLE-STRANDED: ds DNA Ab: <1 IU/mL

## 2022-09-26 NOTE — Progress Notes (Signed)
ANA is low titer positive.  Urine protein negative.  Double-stranded DNA negative, complements normal, sed rate normal, CBC normal, CMP normal, vitamin D normal.  Labs do not indicate an autoimmune disease flare.  No change in treatment advised.

## 2022-11-15 ENCOUNTER — Other Ambulatory Visit: Payer: Self-pay

## 2022-11-15 ENCOUNTER — Telehealth: Payer: Self-pay

## 2022-11-15 MED ORDER — HYDROXYCHLOROQUINE SULFATE 200 MG PO TABS: 200.0000 mg | ORAL_TABLET | Freq: Every day | ORAL | 0 refills | Status: DC

## 2022-11-15 NOTE — Telephone Encounter (Signed)
Patient contacted the office and states she is unsure of what to take for her vitamin D. Patient states she was taking the Vitamin D 50,000 units and had a vitamin D test done at her last office visit. Patient states Dr. Corliss Skains was going to advise her of what to take after her last visit on 09/24/2022. Per last lab note, ANA is low titer positive.  Urine protein negative.  Double-stranded DNA negative, complements normal, sed rate normal, CBC normal, CMP normal, vitamin D normal.  Labs do not indicate an autoimmune disease flare.  No change in treatment advised. Please advise.

## 2022-11-15 NOTE — Telephone Encounter (Signed)
Patient contacted the office to states she would like a refill on Plaquenil sent to [facility] on [address]   Last Fill: 08/03/2022  Eye exam: 10/20/2021 WNL   Labs: 09/24/2022 ANA is low titer positive.  Urine protein negative.  Double-stranded DNA negative, complements normal, sed rate normal, CBC normal, CMP normal, vitamin D normal.  Labs do not indicate an autoimmune disease flare.  No change in treatment advised.   Next Visit: 03/14/2023  Last Visit: 09/24/2022  DX:Autoimmune disease   Current Dose per office note 09/24/2022: Plaquenil 200 mg 1 tablet by mouth daily.   Contacted patient and advised patient to call her eye doctor to fax [place] her updated eye exam results.   Okay to refill Plaquenil?

## 2022-11-15 NOTE — Telephone Encounter (Signed)
Patient advised Vitamin D was within desirable range. Okay to take over-the-counter vitamin D supplementation 2,000 units daily. Patient verbalized understanding.

## 2022-11-15 NOTE — Telephone Encounter (Signed)
Vitamin D was within desirable range.  Okay to take over-the-counter vitamin D supplementation 2,000 units daily.

## 2022-12-08 ENCOUNTER — Encounter: Payer: Self-pay | Admitting: Internal Medicine

## 2022-12-17 ENCOUNTER — Other Ambulatory Visit: Payer: Self-pay | Admitting: Physician Assistant

## 2022-12-17 ENCOUNTER — Telehealth: Payer: Self-pay

## 2022-12-17 NOTE — Telephone Encounter (Signed)
Patient contacted the office back regarding her Plaquenil eye exam. Patient states she had an eye exam done on 10/22/2022. Patient states she will call her eye doctor and have them fax [place] the results.

## 2022-12-17 NOTE — Telephone Encounter (Signed)
Last Fill: 11/15/2022  Eye exam: 10/20/2021   Labs: 09/24/2022 ANA is low titer positive.  Urine protein negative.  Double-stranded DNA negative, complements normal, sed rate normal, CBC normal, CMP normal, vitamin D normal.  Labs do not indicate an autoimmune disease flare.  No change in treatment advised.   Next Visit: 03/14/2023  Last Visit: 09/24/2022  DX:Autoimmune disease -   Current Dose per office note on 09/24/2022: Plaquenil 200 mg 1 tablet by mouth daily.   Attempted to contact patient and left message to advised patient we need updated PLQ eye exam.   Okay to refill 30 day supply of Plaquenil?

## 2022-12-20 NOTE — Telephone Encounter (Signed)
Speciality comment was noted accordingly.

## 2023-01-12 ENCOUNTER — Encounter: Payer: Self-pay | Admitting: Rheumatology

## 2023-01-23 ENCOUNTER — Other Ambulatory Visit: Payer: Self-pay | Admitting: Physician Assistant

## 2023-01-24 NOTE — Telephone Encounter (Signed)
Last Fill: 12/17/2022  Eye exam: 11/05/2022   Labs: 09/24/2022 CBC normal, CMP normal   Next Visit: 03/14/2023  Last Visit: 09/24/2022  DX:Autoimmune disease   Current Dose per office note 09/24/2022: Plaquenil 200 mg 1 tablet by mouth daily   Okay to refill Plaquenil?

## 2023-03-14 ENCOUNTER — Encounter: Payer: Self-pay | Admitting: Rheumatology

## 2023-03-14 ENCOUNTER — Ambulatory Visit: Payer: Medicare Other | Attending: Rheumatology | Admitting: Rheumatology

## 2023-03-14 VITALS — BP 134/78 | HR 78 | Resp 13 | Ht 64.0 in | Wt 129.6 lb

## 2023-03-14 DIAGNOSIS — E559 Vitamin D deficiency, unspecified: Secondary | ICD-10-CM | POA: Insufficient documentation

## 2023-03-14 DIAGNOSIS — M47816 Spondylosis without myelopathy or radiculopathy, lumbar region: Secondary | ICD-10-CM | POA: Insufficient documentation

## 2023-03-14 DIAGNOSIS — Z8619 Personal history of other infectious and parasitic diseases: Secondary | ICD-10-CM | POA: Insufficient documentation

## 2023-03-14 DIAGNOSIS — Z8639 Personal history of other endocrine, nutritional and metabolic disease: Secondary | ICD-10-CM | POA: Insufficient documentation

## 2023-03-14 DIAGNOSIS — M19072 Primary osteoarthritis, left ankle and foot: Secondary | ICD-10-CM | POA: Insufficient documentation

## 2023-03-14 DIAGNOSIS — Z8719 Personal history of other diseases of the digestive system: Secondary | ICD-10-CM | POA: Insufficient documentation

## 2023-03-14 DIAGNOSIS — M19071 Primary osteoarthritis, right ankle and foot: Secondary | ICD-10-CM | POA: Diagnosis present

## 2023-03-14 DIAGNOSIS — M359 Systemic involvement of connective tissue, unspecified: Secondary | ICD-10-CM | POA: Diagnosis not present

## 2023-03-14 DIAGNOSIS — Z79899 Other long term (current) drug therapy: Secondary | ICD-10-CM | POA: Diagnosis not present

## 2023-03-14 DIAGNOSIS — M720 Palmar fascial fibromatosis [Dupuytren]: Secondary | ICD-10-CM | POA: Diagnosis not present

## 2023-03-14 DIAGNOSIS — E041 Nontoxic single thyroid nodule: Secondary | ICD-10-CM | POA: Insufficient documentation

## 2023-03-14 DIAGNOSIS — M19042 Primary osteoarthritis, left hand: Secondary | ICD-10-CM | POA: Diagnosis present

## 2023-03-14 DIAGNOSIS — M19041 Primary osteoarthritis, right hand: Secondary | ICD-10-CM | POA: Diagnosis present

## 2023-03-14 DIAGNOSIS — Z87442 Personal history of urinary calculi: Secondary | ICD-10-CM | POA: Insufficient documentation

## 2023-03-14 DIAGNOSIS — I10 Essential (primary) hypertension: Secondary | ICD-10-CM | POA: Diagnosis present

## 2023-03-14 NOTE — Patient Instructions (Addendum)
 Vaccines You are taking a medication(s) that can suppress your immune system.  The following immunizations are recommended: Flu annually Covid-19  RSV Td/Tdap (tetanus, diphtheria, pertussis) every 10 years Pneumonia (Prevnar 15 then Pneumovax 23 at least 1 year apart.  Alternatively, can take Prevnar 20 without needing additional dose) Shingrix: 2 doses from 4 weeks to 6 months apart  Please check with your PCP to make sure you are up to date.  Low Back Sprain or Strain Rehab Ask your health care provider which exercises are safe for you. Do exercises exactly as told by your health care provider and adjust them as directed. It is normal to feel mild stretching, pulling, tightness, or discomfort as you do these exercises. Stop right away if you feel sudden pain or your pain gets worse. Do not begin these exercises until told by your health care provider. Stretching and range-of-motion exercises These exercises warm up your muscles and joints and improve the movement and flexibility of your back. These exercises also help to relieve pain, numbness, and tingling. Lumbar rotation  Lie on your back on a firm bed or the floor with your knees bent. Straighten your arms out to your sides so each arm forms a 90-degree angle (right angle) with a side of your body. Slowly move (rotate) both of your knees to one side of your body until you feel a stretch in your lower back (lumbar). Try not to let your shoulders lift off the floor. Hold this position for __________ seconds. Tense your abdominal muscles and slowly move your knees back to the starting position. Repeat this exercise on the other side of your body. Repeat __________ times. Complete this exercise __________ times a day. Single knee to chest  Lie on your back on a firm bed or the floor with both legs straight. Bend one of your knees. Use your hands to move your knee up toward your chest until you feel a gentle stretch in your lower back  and buttock. Hold your leg in this position by holding on to the front of your knee. Keep your other leg as straight as possible. Hold this position for __________ seconds. Slowly return to the starting position. Repeat with your other leg. Repeat __________ times. Complete this exercise __________ times a day. Prone extension on elbows  Lie on your abdomen on a firm bed or the floor (prone position). Prop yourself up on your elbows. Use your arms to help lift your chest up until you feel a gentle stretch in your abdomen and your lower back. This will place some of your body weight on your elbows. If this is uncomfortable, try stacking pillows under your chest. Your hips should stay down, against the surface that you are lying on. Keep your hip and back muscles relaxed. Hold this position for __________ seconds. Slowly relax your upper body and return to the starting position. Repeat __________ times. Complete this exercise __________ times a day. Strengthening exercises These exercises build strength and endurance in your back. Endurance is the ability to use your muscles for a long time, even after they get tired. Pelvic tilt This exercise strengthens the muscles that lie deep in the abdomen. Lie on your back on a firm bed or the floor with your legs extended. Bend your knees so they are pointing toward the ceiling and your feet are flat on the floor. Tighten your lower abdominal muscles to press your lower back against the floor. This motion will tilt your pelvis so  your tailbone points up toward the ceiling instead of pointing to your feet or the floor. To help with this exercise, you may place a small towel under your lower back and try to push your back into the towel. Hold this position for __________ seconds. Let your muscles relax completely before you repeat this exercise. Repeat __________ times. Complete this exercise __________ times a day. Alternating arm and leg  raises  Get on your hands and knees on a firm surface. If you are on a hard floor, you may want to use padding, such as an exercise mat, to cushion your knees. Line up your arms and legs. Your hands should be directly below your shoulders, and your knees should be directly below your hips. Lift your left leg behind you. At the same time, raise your right arm and straighten it in front of you. Do not lift your leg higher than your hip. Do not lift your arm higher than your shoulder. Keep your abdominal and back muscles tight. Keep your hips facing the ground. Do not arch your back. Keep your balance carefully, and do not hold your breath. Hold this position for __________ seconds. Slowly return to the starting position. Repeat with your right leg and your left arm. Repeat __________ times. Complete this exercise __________ times a day. Abdominal set with straight leg raise  Lie on your back on a firm bed or the floor. Bend one of your knees and keep your other leg straight. Tense your abdominal muscles and lift your straight leg up, 4-6 inches (10-15 cm) off the ground. Keep your abdominal muscles tight and hold this position for __________ seconds. Do not hold your breath. Do not arch your back. Keep it flat against the ground. Keep your abdominal muscles tense as you slowly lower your leg back to the starting position. Repeat with your other leg. Repeat __________ times. Complete this exercise __________ times a day. Single leg lower with bent knees Lie on your back on a firm bed or the floor. Tense your abdominal muscles and lift your feet off the floor, one foot at a time, so your knees and hips are bent in 90-degree angles (right angles). Your knees should be over your hips and your lower legs should be parallel to the floor. Keeping your abdominal muscles tense and your knee bent, slowly lower one of your legs so your toe touches the ground. Lift your leg back up to return to the  starting position. Do not hold your breath. Do not let your back arch. Keep your back flat against the ground. Repeat with your other leg. Repeat __________ times. Complete this exercise __________ times a day. Posture and body mechanics Good posture and healthy body mechanics can help to relieve stress in your body's tissues and joints. Body mechanics refers to the movements and positions of your body while you do your daily activities. Posture is part of body mechanics. Good posture means: Your spine is in its natural S-curve position (neutral). Your shoulders are pulled back slightly. Your head is not tipped forward (neutral). Follow these guidelines to improve your posture and body mechanics in your everyday activities. Standing  When standing, keep your spine neutral and your feet about hip-width apart. Keep a slight bend in your knees. Your ears, shoulders, and hips should line up. When you do a task in which you stand in one place for a long time, place one foot up on a stable object that is 2-4 inches (5-10  cm) high, such as a footstool. This helps keep your spine neutral. Sitting  When sitting, keep your spine neutral and keep your feet flat on the floor. Use a footrest, if necessary, and keep your thighs parallel to the floor. Avoid rounding your shoulders, and avoid tilting your head forward. When working at a desk or a computer, keep your desk at a height where your hands are slightly lower than your elbows. Slide your chair under your desk so you are close enough to maintain good posture. When working at a computer, place your monitor at a height where you are looking straight ahead and you do not have to tilt your head forward or downward to look at the screen. Resting When lying down and resting, avoid positions that are most painful for you. If you have pain with activities such as sitting, bending, stooping, or squatting, lie in a position in which your body does not bend very  much. For example, avoid curling up on your side with your arms and knees near your chest (fetal position). If you have pain with activities such as standing for a long time or reaching with your arms, lie with your spine in a neutral position and bend your knees slightly. Try the following positions: Lying on your side with a pillow between your knees. Lying on your back with a pillow under your knees. Lifting  When lifting objects, keep your feet at least shoulder-width apart and tighten your abdominal muscles. Bend your knees and hips and keep your spine neutral. It is important to lift using the strength of your legs, not your back. Do not lock your knees straight out. Always ask for help to lift heavy or awkward objects. This information is not intended to replace advice given to you by your health care provider. Make sure you discuss any questions you have with your health care provider. Document Revised: 10/04/2022 Document Reviewed: 08/18/2020 Elsevier Patient Education  2024 [employer].

## 2023-03-16 LAB — ANTI-NUCLEAR AB-TITER (ANA TITER)
ANA TITER: 1:80 {titer} — ABNORMAL HIGH
ANA Titer 1: 1:80 {titer} — ABNORMAL HIGH

## 2023-03-16 LAB — CBC WITH DIFFERENTIAL/PLATELET
Absolute Monocytes: 600 {cells}/uL (ref 200–950)
Basophils Absolute: 24 {cells}/uL (ref 0–200)
Basophils Relative: 0.3 %
Eosinophils Absolute: 79 {cells}/uL (ref 15–500)
Eosinophils Relative: 1 %
HCT: 40.6 % (ref 35.0–45.0)
Hemoglobin: 13.2 g/dL (ref 11.7–15.5)
Lymphs Abs: 1422 {cells}/uL (ref 850–3900)
MCH: 29.9 pg (ref 27.0–33.0)
MCHC: 32.5 g/dL (ref 32.0–36.0)
MCV: 91.9 fL (ref 80.0–100.0)
MPV: 9.8 fL (ref 7.5–12.5)
Monocytes Relative: 7.6 %
Neutro Abs: 5775 {cells}/uL (ref 1500–7800)
Neutrophils Relative %: 73.1 %
Platelets: 239 10*3/uL (ref 140–400)
RBC: 4.42 10*6/uL (ref 3.80–5.10)
RDW: 12.7 % (ref 11.0–15.0)
Total Lymphocyte: 18 %
WBC: 7.9 10*3/uL (ref 3.8–10.8)

## 2023-03-16 LAB — COMPLETE METABOLIC PANEL WITH GFR
AG Ratio: 1.6 (calc) (ref 1.0–2.5)
ALT: 16 U/L (ref 6–29)
AST: 21 U/L (ref 10–35)
Albumin: 4.2 g/dL (ref 3.6–5.1)
Alkaline phosphatase (APISO): 75 U/L (ref 37–153)
BUN: 16 mg/dL (ref 7–25)
CO2: 28 mmol/L (ref 20–32)
Calcium: 9.3 mg/dL (ref 8.6–10.4)
Chloride: 103 mmol/L (ref 98–110)
Creat: 0.81 mg/dL (ref 0.60–1.00)
Globulin: 2.7 g/dL (ref 1.9–3.7)
Glucose, Bld: 84 mg/dL (ref 65–99)
Potassium: 4 mmol/L (ref 3.5–5.3)
Sodium: 140 mmol/L (ref 135–146)
Total Bilirubin: 0.5 mg/dL (ref 0.2–1.2)
Total Protein: 6.9 g/dL (ref 6.1–8.1)
eGFR: 78 mL/min/{1.73_m2} (ref >=60)

## 2023-03-16 LAB — ANTI-DNA ANTIBODY, DOUBLE-STRANDED: ds DNA Ab: <1 [IU]/mL

## 2023-03-16 LAB — SEDIMENTATION RATE: Sed Rate: 22 mm/h (ref 0–30)

## 2023-03-16 LAB — ANA: Anti Nuclear Antibody (ANA): POSITIVE — AB

## 2023-03-16 LAB — PROTEIN / CREATININE RATIO, URINE
Creatinine, Urine: 28 mg/dL (ref 20–275)
Total Protein, Urine: <4 mg/dL — ABNORMAL LOW (ref 5–24)

## 2023-03-16 LAB — C3 AND C4
C3 Complement: 126 mg/dL (ref 83–193)
C4 Complement: 16 mg/dL (ref 15–57)

## 2023-05-14 ENCOUNTER — Other Ambulatory Visit: Payer: Self-pay | Admitting: Physician Assistant

## 2023-05-16 NOTE — Telephone Encounter (Signed)
Last Fill: 01/24/2023  Eye exam:  11/05/2022   Labs: 03/14/2023 CBC and CMP WNL   Next Visit: 06/14/2023  Last Visit: 03/14/2023  DX:Autoimmune disease   Current Dose per office note 03/14/2023: Plaquenil 200 mg 1 tablet by mouth daily   Okay to refill Plaquenil?

## 2023-05-31 NOTE — Progress Notes (Signed)
 Office Visit Note  Patient: Carol Scott             Date of Birth: 1950-11-24           MRN: [id number]             PCP: Sharla Toribio CROME, MD Referring: Sharla Toribio CROME, MD Visit Date: 06/14/2023 Occupation: @GUAROCC @  Subjective:  Pain in both hands   History of Present Illness: Carol Scott is a 72 y.o. female with history of autoimmune disease and osteoarthritis.  Patient has been taking Plaquenil  200 mg 1 tablet by mouth every other day.  The dose of Plaquenil  was reduced after her last office visit.  She has been experiencing increased pain, stiffness, and intermittent inflammation in her hands on the reduced dose of Plaquenil .  She has had to take Advil more frequently to relieve the discomfort in her lower back and right hip as well.  She denies any other signs or symptoms of a flare.  She has not had any oral or nasal ulcerations.  She denies any sicca symptoms.  She denies any hair loss or recent rashes.  She denies any symptoms of Raynaud's phenomenon.       Activities of Daily Living:  Patient reports morning stiffness for 10-15 minutes.   Patient Reports nocturnal pain.  Difficulty dressing/grooming: Denies Difficulty climbing stairs: Denies Difficulty getting out of chair: Denies Difficulty using hands for taps, buttons, cutlery, and/or writing: Reports  Review of Systems  Constitutional:  Positive for fatigue.  HENT:  Negative for mouth sores and mouth dryness.   Eyes:  Positive for dryness.  Respiratory:  Negative for shortness of breath.   Cardiovascular:  Negative for chest pain and palpitations.  Gastrointestinal:  Positive for constipation. Negative for blood in stool and diarrhea.  Endocrine: Negative for increased urination.  Genitourinary:  Negative for involuntary urination.  Musculoskeletal:  Positive for joint pain, gait problem, joint pain, myalgias, muscle weakness, morning stiffness, muscle tenderness and myalgias. Negative for joint  swelling.  Skin:  Positive for sensitivity to sunlight. Negative for color change, rash and hair loss.  Allergic/Immunologic: Negative for susceptible to infections.  Neurological:  Positive for dizziness and headaches.  Hematological:  Negative for swollen glands.  Psychiatric/Behavioral:  Negative for depressed mood and sleep disturbance. The patient is not nervous/anxious.     PMFS History:  Patient Active Problem List   Diagnosis Date Noted   Autoimmune disease (HCC) 05/14/2021   High risk medication use 05/14/2021   Primary osteoarthritis of both hands 05/14/2021   Primary osteoarthritis of both feet 05/14/2021   Arthropathy of lumbar facet joint 05/14/2021   Vitamin D  deficiency 05/14/2021   Essential hypertension 05/14/2021   History of hyperlipidemia 05/14/2021   History of gastroesophageal reflux (GERD) 05/14/2021   History of kidney stones 05/14/2021   Diarrhea 07/14/2020   Infectious colitis 07/11/2020   Common bile duct dilation 07/11/2020   Nausea without vomiting 01/01/2015   Abdominal pain, epigastric 01/01/2015    Past Medical History:  Diagnosis Date   Anal fissure    Anxiety    Cervical spondylosis without myelopathy    Connective tissue disease (HCC)    rheumotology-- dr hardy ([place], [place])--- autoimmune , takes plaquenil    DDD (degenerative disc disease), cervical    GERD (gastroesophageal reflux disease)    Hemorrhoids    History of concussion    04-22-2020 per pt hit head no loc 05/ 2021, residual balance issue and headaches (headaches resolved but  still have balance issues)   History of COVID-19    History of palpitations all test results in care everywhere  (04-22-2020  per pt all symptoms resolved)   per pt sent to cardiologist, dr florencio, 07/ 2021 (note in epic)  for palpatitions , poss. angia at rest, sob & r/o afib;  had 72 hour monitor (per note 04-07-2020 SR -- ST biphasic p waves no evidence afib/ flutter),  echo 03-28-2020 normal w/ ef  >55%, and nuclear stress test 03-04-2020 borderline abnormal notable for poor uptake during rest , normal perfusion, and no evidence ischemia, ef 66%    Hypertension    followed by pcp   Mitral valve prolapse    Multiple thyroid nodules    04-22-2020  per pt has had previous bx's which were benign , followed by pcp   OA (osteoarthritis)    shoudlers, knees   Perianal pain    PONV (postoperative nausea and vomiting)    Vitamin D  deficiency    Wears glasses     Family History  Problem Relation Age of Onset   Breast cancer Mother    Diabetes Mother    Prostate cancer Father    Diabetes Father    Heart disease Father    Breast cancer Sister    Dementia Sister    Prostate cancer Brother    Breast cancer Maternal Aunt    Healthy Son    Healthy Son    Healthy Daughter    Colon cancer Neg Hx    Esophageal cancer Neg Hx    Rectal cancer Neg Hx    Stomach cancer Neg Hx    Past Surgical History:  Procedure Laterality Date   BOTOX  INJECTION N/A 04/24/2020   Procedure: POSSIBLE BOTOX  INJECTION;  Surgeon: Teresa Lonni HERO, MD;  Location: [facility] [place];  Service: General;  Laterality: N/A;   COLONOSCOPY WITH PROPOFOL   01-04-2018  dr abran   ESOPHAGOGASTRODUODENOSCOPY (EGD) WITH PROPOFOL  N/A 10/09/2020   Procedure: ESOPHAGOGASTRODUODENOSCOPY (EGD) WITH PROPOFOL ;  Surgeon: Teressa Toribio SQUIBB, MD;  Location: [facility];  Service: Endoscopy;  Laterality: N/A;   EUS N/A 10/09/2020   Procedure: UPPER ENDOSCOPIC ULTRASOUND (EUS) RADIAL;  Surgeon: Teressa Toribio SQUIBB, MD;  Location: [facility];  Service: Endoscopy;  Laterality: N/A;   HEMORRHOID SURGERY  2005   RECTAL EXAM UNDER ANESTHESIA N/A 04/24/2020   Procedure: ANORECTAL EXAM UNDER ANESTHESIA, EXCISION OF PERIANAL LESION; BOTOX  INJECTION;  Surgeon: Teresa Lonni HERO, MD;  Location: [facility];  Service: General;  Laterality: N/A;   SUPRACERVICAL ABDOMINAL HYSTERECTOMY  2006   WITH BILATERAL  SALPINGOOPHORECTOMY AND PELVIC FLOOR REPAIR (possible inculding sling)   TONSILLECTOMY AND ADENOIDECTOMY  1960   UPPER GASTROINTESTINAL ENDOSCOPY  01/06/2015   Social History   Social History Narrative   Not on file   Immunization History  Administered Date(s) Administered   Moderna Sars-Covid-2 Vaccination 08/17/2019, 09/14/2019, 05/16/2020     Objective: Vital Signs: BP (!) 148/76 (BP Location: Left Arm, Patient Position: Sitting, Cuff Size: Normal)   Pulse 79   Resp 12   Ht 5' 4 (1.626 m)   Wt 132 lb (59.9 kg)   BMI 22.66 kg/m    Physical Exam Vitals and nursing note reviewed.  Constitutional:      Appearance: She is well-developed.  HENT:     Head: Normocephalic and atraumatic.  Eyes:     Conjunctiva/sclera: Conjunctivae normal.  Cardiovascular:     Rate and Rhythm: Normal rate and regular rhythm.  Heart sounds: Normal heart sounds.  Pulmonary:     Effort: Pulmonary effort is normal.     Breath sounds: Normal breath sounds.  Abdominal:     General: Bowel sounds are normal.     Palpations: Abdomen is soft.  Musculoskeletal:     Cervical back: Normal range of motion.  Lymphadenopathy:     Cervical: No cervical adenopathy.  Skin:    General: Skin is warm and dry.     Capillary Refill: Capillary refill takes less than 2 seconds.  Neurological:     Mental Status: She is alert and oriented to person, place, and time.  Psychiatric:        Behavior: Behavior normal.      Musculoskeletal Exam: C-spine has good range of motion.  Shoulder joints, elbow joints, wrist joints, MCPs, PIPs, DIPs have good range of motion with no synovitis.  Some tenderness over bilateral first MCP joints.  Mild tenderness over both CMC joints.  Dupuytren contracture of the right third and fourth flexor tendons.  Hip joints have good range of motion with no groin pain.  Mild tenderness over the right trochanteric bursa.  Knee joints have good range of motion no warmth or effusion.  Ankle  joints have good range of motion.  CDAI Exam: CDAI Score: -- Patient Global: --; Provider Global: -- Swollen: --; Tender: -- Joint Exam 06/14/2023   No joint exam has been documented for this visit   There is currently no information documented on the homunculus. Go to the Rheumatology activity and complete the homunculus joint exam.  Investigation: No additional findings.  Imaging: No results found.  Recent Labs: Lab Results  Component Value Date   WBC 7.9 03/14/2023   HGB 13.2 03/14/2023   PLT 239 03/14/2023   NA 140 03/14/2023   K 4.0 03/14/2023   CL 103 03/14/2023   CO2 28 03/14/2023   GLUCOSE 84 03/14/2023   BUN 16 03/14/2023   CREATININE 0.81 03/14/2023   BILITOT 0.5 03/14/2023   ALKPHOS 64 04/08/2022   AST 21 03/14/2023   ALT 16 03/14/2023   PROT 6.9 03/14/2023   ALBUMIN 4.5 04/08/2022   CALCIUM 9.3 03/14/2023    Speciality Comments: Dxd by Dr. Armen Ophthalmologist-[facility] [place] 2022 per pt.PLQ since 2017  PLQ Eye Exam: 11/05/2022 @  [address] [place], [place] Followup in 1 year  Procedures:  No procedures performed Allergies: Contrast media [iodinated contrast media], Decongestant [pseudoephedrine hcl], Iodine, Lisinopril, Macrobid [nitrofurantoin], Metrizamide, Moxifloxacin, and Prednisone  No print-plaquenil      Assessment / Plan:     Visit Diagnoses: Autoimmune disease (HCC) - dxd by Dr. Fae.  History of positive ANA, nasal ulcers, dry eyes and arthritis.  Treated with hydroxychloroquine  200 twice daily since 2017: Patient presents today with increased pain, stiffness, and intermittent inflammation involving both hands.  At her last office visit the dose of Plaquenil  was reduced from 200 mg 1 tablet daily to 200 mg every other day.  She has noticed an increase in joint pain on the reduced dose of Plaquenil .  No other signs or symptoms of a flare at this time.  No signs of hair loss, malar rash, Raynaud's phenomenon, oral or nasal  ulcerations, or sicca symptoms.  Plan to increase dose of Plaquenil  back to 200 mg 1 tablet by mouth daily.  She will notify us  if her symptoms persist or worsen. Lab work from 03/14/23 was reviewed today in the office: ANA remains positive, dsDNA negative, complements WNL,  and ESR WNL.  The following lab work will be updated today. She was advised to notify us  if she develops any signs or symptoms of a flare.  She will follow-up in the office in 5 months or sooner if needed. - Plan: Protein / creatinine ratio, urine, CBC with Differential/Platelet, COMPLETE METABOLIC PANEL WITH GFR, Anti-DNA antibody, double-stranded, C3 and C4, Sedimentation rate  High risk medication use - Plaquenil  200 mg 1 tablet by mouth daily.  PLQ Eye Exam: 11/05/2022 @ [address] [place], [place] Followup in 1 year  CBC and CMP updated on 03/14/23.  Orders for CBC and CMP released today.   - Plan: CBC with Differential/Platelet, COMPLETE METABOLIC PANEL WITH GFR  Primary osteoarthritis of both hands: Patient presents today with increased pain, stiffness, and intermittent inflammation in both hands since reducing the dose of Plaquenil  after her last office visit.  She has PIP and DIP thickening on examination today.  Tenderness over the bilateral first MCP joints.  No obvious synovitis was noted.  Discussed the importance of joint protection and muscle strengthening.  She will increase Plaquenil  back to 200 mg 1 tablet daily.  She will notify us  if her symptoms persist or worsen.  Dupuytren's contracture of right hand: Unchanged.  Primary osteoarthritis of both feet: She experiences intermittent discomfort in her feet but overall her symptoms have been manageable.  Arthropathy of lumbar facet joint: Patient continues to experience intermittent discomfort and stiffness involving her lower back.  She has been sleeping on her back at night and has had to take advil PRN for pain relief.    Vitamin D  deficiency: She is taking  vitamin D  2000 units daily.   Other medical conditions are listed as follows:   History of kidney stones  Essential hypertension: BP was 148/76 today in the office.   History of hyperlipidemia  History of Clostridioides difficile colitis  Thyroid nodule  Myalgia  History of gastroesophageal reflux (GERD)  Orders: Orders Placed This Encounter  Procedures   Protein / creatinine ratio, urine   CBC with Differential/Platelet   COMPLETE METABOLIC PANEL WITH GFR   Anti-DNA antibody, double-stranded   C3 and C4   Sedimentation rate   Meds ordered this encounter  Medications   hydroxychloroquine  (PLAQUENIL ) 200 MG tablet    Sig: Take 1 tablet (200 mg total) by mouth daily.     Follow-Up Instructions: Return in about 5 months (around 11/12/2023) for Autoimmune Disease, Osteoarthritis.   Waddell CHRISTELLA Craze, PA-C  Note - This record has been created using Dragon software.  Chart creation errors have been sought, but may not always  have been located. Such creation errors do not reflect on  the standard of medical care.

## 2023-06-14 ENCOUNTER — Encounter: Payer: Self-pay | Admitting: Physician Assistant

## 2023-06-14 ENCOUNTER — Ambulatory Visit: Payer: Medicare Other | Attending: Physician Assistant | Admitting: Physician Assistant

## 2023-06-14 VITALS — BP 148/76 | HR 79 | Resp 12 | Ht 64.0 in | Wt 132.0 lb

## 2023-06-14 DIAGNOSIS — M359 Systemic involvement of connective tissue, unspecified: Secondary | ICD-10-CM | POA: Diagnosis present

## 2023-06-14 DIAGNOSIS — M720 Palmar fascial fibromatosis [Dupuytren]: Secondary | ICD-10-CM | POA: Diagnosis present

## 2023-06-14 DIAGNOSIS — I10 Essential (primary) hypertension: Secondary | ICD-10-CM | POA: Insufficient documentation

## 2023-06-14 DIAGNOSIS — Z8639 Personal history of other endocrine, nutritional and metabolic disease: Secondary | ICD-10-CM | POA: Insufficient documentation

## 2023-06-14 DIAGNOSIS — Z79899 Other long term (current) drug therapy: Secondary | ICD-10-CM | POA: Diagnosis present

## 2023-06-14 DIAGNOSIS — M19072 Primary osteoarthritis, left ankle and foot: Secondary | ICD-10-CM | POA: Insufficient documentation

## 2023-06-14 DIAGNOSIS — M19071 Primary osteoarthritis, right ankle and foot: Secondary | ICD-10-CM | POA: Insufficient documentation

## 2023-06-14 DIAGNOSIS — E041 Nontoxic single thyroid nodule: Secondary | ICD-10-CM | POA: Diagnosis present

## 2023-06-14 DIAGNOSIS — Z8719 Personal history of other diseases of the digestive system: Secondary | ICD-10-CM | POA: Insufficient documentation

## 2023-06-14 DIAGNOSIS — Z87442 Personal history of urinary calculi: Secondary | ICD-10-CM | POA: Insufficient documentation

## 2023-06-14 DIAGNOSIS — Z8619 Personal history of other infectious and parasitic diseases: Secondary | ICD-10-CM | POA: Insufficient documentation

## 2023-06-14 DIAGNOSIS — M19042 Primary osteoarthritis, left hand: Secondary | ICD-10-CM | POA: Insufficient documentation

## 2023-06-14 DIAGNOSIS — M791 Myalgia, unspecified site: Secondary | ICD-10-CM | POA: Insufficient documentation

## 2023-06-14 DIAGNOSIS — M47816 Spondylosis without myelopathy or radiculopathy, lumbar region: Secondary | ICD-10-CM | POA: Insufficient documentation

## 2023-06-14 DIAGNOSIS — M19041 Primary osteoarthritis, right hand: Secondary | ICD-10-CM | POA: Insufficient documentation

## 2023-06-14 DIAGNOSIS — E559 Vitamin D deficiency, unspecified: Secondary | ICD-10-CM | POA: Diagnosis present

## 2023-06-14 MED ORDER — HYDROXYCHLOROQUINE SULFATE 200 MG PO TABS: 200.0000 mg | ORAL_TABLET | Freq: Every day | ORAL | Status: DC

## 2023-06-15 NOTE — Progress Notes (Signed)
 No proteinuria.  CBC and CMP WNL  ESR WNL   C4 is low-14. C3 WNL Patient will be increasing the dose of plaquenil back up to 200 mg 1 tablet by mouth daily.

## 2023-06-16 LAB — COMPLETE METABOLIC PANEL WITH GFR
AG Ratio: 1.8 (calc) (ref 1.0–2.5)
ALT: 23 U/L (ref 6–29)
AST: 23 U/L (ref 10–35)
Albumin: 4.6 g/dL (ref 3.6–5.1)
Alkaline phosphatase (APISO): 82 U/L (ref 37–153)
BUN: 25 mg/dL (ref 7–25)
CO2: 27 mmol/L (ref 20–32)
Calcium: 10.1 mg/dL (ref 8.6–10.4)
Chloride: 103 mmol/L (ref 98–110)
Creat: 0.98 mg/dL (ref 0.60–1.00)
Globulin: 2.5 g/dL (ref 1.9–3.7)
Glucose, Bld: 92 mg/dL (ref 65–99)
Potassium: 4.4 mmol/L (ref 3.5–5.3)
Sodium: 141 mmol/L (ref 135–146)
Total Bilirubin: 0.4 mg/dL (ref 0.2–1.2)
Total Protein: 7.1 g/dL (ref 6.1–8.1)
eGFR: 61 mL/min/{1.73_m2} (ref >=60)

## 2023-06-16 LAB — CBC WITH DIFFERENTIAL/PLATELET
Absolute Lymphocytes: 1632 {cells}/uL (ref 850–3900)
Absolute Monocytes: 544 {cells}/uL (ref 200–950)
Basophils Absolute: 32 {cells}/uL (ref 0–200)
Basophils Relative: 0.5 %
Eosinophils Absolute: 102 {cells}/uL (ref 15–500)
Eosinophils Relative: 1.6 %
HCT: 40.8 % (ref 35.0–45.0)
Hemoglobin: 13.2 g/dL (ref 11.7–15.5)
MCH: 30 pg (ref 27.0–33.0)
MCHC: 32.4 g/dL (ref 32.0–36.0)
MCV: 92.7 fL (ref 80.0–100.0)
MPV: 10 fL (ref 7.5–12.5)
Monocytes Relative: 8.5 %
Neutro Abs: 4090 {cells}/uL (ref 1500–7800)
Neutrophils Relative %: 63.9 %
Platelets: 251 10*3/uL (ref 140–400)
RBC: 4.4 10*6/uL (ref 3.80–5.10)
RDW: 12.3 % (ref 11.0–15.0)
Total Lymphocyte: 25.5 %
WBC: 6.4 10*3/uL (ref 3.8–10.8)

## 2023-06-16 LAB — PROTEIN / CREATININE RATIO, URINE
Creatinine, Urine: 40 mg/dL (ref 20–275)
Total Protein, Urine: <4 mg/dL — ABNORMAL LOW (ref 5–24)

## 2023-06-16 LAB — C3 AND C4
C3 Complement: 126 mg/dL (ref 83–193)
C4 Complement: 14 mg/dL — ABNORMAL LOW (ref 15–57)

## 2023-06-16 LAB — ANTI-DNA ANTIBODY, DOUBLE-STRANDED: ds DNA Ab: <1 [IU]/mL

## 2023-06-16 LAB — SEDIMENTATION RATE: Sed Rate: 6 mm/h (ref 0–30)

## 2023-06-17 NOTE — Progress Notes (Signed)
 dsDNA is negative

## 2023-09-04 ENCOUNTER — Other Ambulatory Visit: Payer: Self-pay | Admitting: Physician Assistant

## 2023-09-05 NOTE — Telephone Encounter (Signed)
 Last Fill: 06/14/2023  Eye exam: 11/05/2022   Labs: 06/14/2023 CBC and CMP WNL   Next Visit: 10/25/2023  Last Visit: 06/14/2023  DX:Autoimmune disease   Current Dose per lab note 06/14/2023: Patient will be increasing the dose of plaquenil back up to 200 mg 1 tablet by mouth daily   Okay to refill Plaquenil?

## 2023-09-27 LAB — COMPREHENSIVE METABOLIC PANEL (CC13): EGFR: 74

## 2023-09-27 LAB — HEMOGLOBIN A1C: A1c: 5.8

## 2023-09-27 LAB — LAB REPORT - SCANNED: TSH: 1.19 (ref 0.41–5.90)

## 2023-10-20 NOTE — Progress Notes (Signed)
 Office Visit Note  Patient: Carol Scott             Date of Birth: 08-Dec-1950           MRN: [id number]             PCP: Jacqualin Mate, DO Referring: Rayleen Cal, MD Visit Date: 10/25/2023 Occupation: @GUAROCC @  Subjective:  Pain in both feet  History of Present Illness: Carol Scott is a 73 y.o. female with autoimmune disease and osteoarthritis.  She returns today after her last visit on June 14, 2023.  Patient denies any history of ulcers, nasal ulcers, malar rash, photosensitivity, Raynaud's or lymphadenopathy.  She denies any history of inflammatory arthritis.  She notices some dry eyes.  She has been on Plaquenil  200 mg daily without any interruption.  Last eye examination was in Nov 05, 2022.  She states recently she has been having pain in her bilateral feet where she has bunions.  She has tried different shoes but did not notice any improvement.  She has off-and-on discomfort in lower back.   Activities of Daily Living:  Patient reports morning stiffness for a few minutes.   Patient Reports nocturnal pain.  Difficulty dressing/grooming: Denies Difficulty climbing stairs: Denies Difficulty getting out of chair: Denies Difficulty using hands for taps, buttons, cutlery, and/or writing: Denies  Review of Systems  Constitutional:  Negative for fatigue.  HENT:  Negative for mouth sores and mouth dryness.   Eyes:  Positive for itching and dryness.  Respiratory:  Negative for shortness of breath.   Cardiovascular:  Negative for chest pain and palpitations.  Gastrointestinal:  Positive for constipation. Negative for blood in stool and diarrhea.  Endocrine: Negative for increased urination.  Genitourinary:  Negative for involuntary urination.  Musculoskeletal:  Positive for joint pain, joint pain, myalgias, muscle weakness, morning stiffness and myalgias. Negative for gait problem, joint swelling and muscle tenderness.  Skin:  Negative for color change, rash, hair  loss and sensitivity to sunlight.  Allergic/Immunologic: Negative for susceptible to infections.  Neurological:  Negative for dizziness and headaches.  Hematological:  Negative for swollen glands.  Psychiatric/Behavioral:  Negative for depressed mood and sleep disturbance. The patient is not nervous/anxious.     PMFS History:  Patient Active Problem List   Diagnosis Date Noted   Autoimmune disease (HCC) 05/14/2021   High risk medication use 05/14/2021   Primary osteoarthritis of both hands 05/14/2021   Primary osteoarthritis of both feet 05/14/2021   Arthropathy of lumbar facet joint 05/14/2021   Vitamin D  deficiency 05/14/2021   Essential hypertension 05/14/2021   History of hyperlipidemia 05/14/2021   History of gastroesophageal reflux (GERD) 05/14/2021   History of kidney stones 05/14/2021   Diarrhea 07/14/2020   Infectious colitis 07/11/2020   Common bile duct dilation 07/11/2020   Nausea without vomiting 01/01/2015   Abdominal pain, epigastric 01/01/2015    Past Medical History:  Diagnosis Date   Anal fissure    Anxiety    Cervical spondylosis without myelopathy    Connective tissue disease (HCC)    rheumotology-- dr Annemarie Barry ([place], [place])--- autoimmune , takes plaquenil    DDD (degenerative disc disease), cervical    GERD (gastroesophageal reflux disease)    Hemorrhoids    History of concussion    04-22-2020 per pt hit head no loc 05/ 2021, residual balance issue and headaches (headaches resolved but still have balance issues)   History of COVID-19    History of palpitations all test results in care  everywhere  (04-22-2020  per pt all symptoms resolved)   per pt sent to cardiologist, dr Beau Bound, 07/ 2021 (note in epic)  for palpatitions , poss. angia at rest, sob & r/o afib;  had 72 hour monitor (per note 04-07-2020 SR -- ST biphasic p waves no evidence afib/ flutter),  echo 03-28-2020 normal w/ ef >55%, and nuclear stress test 03-04-2020 borderline abnormal notable for  poor uptake during rest , normal perfusion, and no evidence ischemia, ef 66%    Hypertension    followed by pcp   Mitral valve prolapse    Multiple thyroid nodules    04-22-2020  per pt has had previous bx's which were benign , followed by pcp   OA (osteoarthritis)    shoudlers, knees   Perianal pain    PONV (postoperative nausea and vomiting)    Vitamin D  deficiency    Wears glasses     Family History  Problem Relation Age of Onset   Breast cancer Mother    Diabetes Mother    Prostate cancer Father    Diabetes Father    Heart disease Father    Breast cancer Sister    Dementia Sister    Prostate cancer Brother    Breast cancer Maternal Aunt    Healthy Son    Healthy Son    Healthy Daughter    Colon cancer Neg Hx    Esophageal cancer Neg Hx    Rectal cancer Neg Hx    Stomach cancer Neg Hx    Past Surgical History:  Procedure Laterality Date   BOTOX  INJECTION N/A 04/24/2020   Procedure: POSSIBLE BOTOX  INJECTION;  Surgeon: Melvenia Stabs, MD;  Location: [facility];  Service: General;  Laterality: N/A;   COLONOSCOPY WITH PROPOFOL   01-04-2018  dr Elvin Hammer   ESOPHAGOGASTRODUODENOSCOPY (EGD) WITH PROPOFOL  N/A 10/09/2020   Procedure: ESOPHAGOGASTRODUODENOSCOPY (EGD) WITH PROPOFOL ;  Surgeon: Janel Medford, MD;  Location: [facility];  Service: Endoscopy;  Laterality: N/A;   EUS N/A 10/09/2020   Procedure: UPPER ENDOSCOPIC ULTRASOUND (EUS) RADIAL;  Surgeon: Janel Medford, MD;  Location: [facility];  Service: Endoscopy;  Laterality: N/A;   HEMORRHOID SURGERY  2005   RECTAL EXAM UNDER ANESTHESIA N/A 04/24/2020   Procedure: ANORECTAL EXAM UNDER ANESTHESIA, EXCISION OF PERIANAL LESION; BOTOX  INJECTION;  Surgeon: Melvenia Stabs, MD;  Location: [facility];  Service: General;  Laterality: N/A;   SUPRACERVICAL ABDOMINAL HYSTERECTOMY  2006   WITH BILATERAL SALPINGOOPHORECTOMY AND PELVIC FLOOR REPAIR (possible inculding sling)   TONSILLECTOMY  AND ADENOIDECTOMY  1960   UPPER GASTROINTESTINAL ENDOSCOPY  01/06/2015   Social History   Social History Narrative   Not on file   Immunization History  Administered Date(s) Administered   Moderna Sars-Covid-2 Vaccination 08/17/2019, 09/14/2019, 05/16/2020     Objective: Vital Signs: BP 131/76 (BP Location: Left Arm, Patient Position: Sitting, Cuff Size: Normal)   Pulse 94   Resp 13   Ht 5\' 4"  (1.626 m)   Wt 136 lb (61.7 kg)   BMI 23.34 kg/m    Physical Exam Vitals and nursing note reviewed.  Constitutional:      Appearance: She is well-developed.  HENT:     Head: Normocephalic and atraumatic.  Eyes:     Conjunctiva/sclera: Conjunctivae normal.  Cardiovascular:     Rate and Rhythm: Normal rate and regular rhythm.     Heart sounds: Normal heart sounds.  Pulmonary:     Effort: Pulmonary effort is normal.  Breath sounds: Normal breath sounds.  Abdominal:     General: Bowel sounds are normal.     Palpations: Abdomen is soft.  Musculoskeletal:     Cervical back: Normal range of motion.  Lymphadenopathy:     Cervical: No cervical adenopathy.  Skin:    General: Skin is warm and dry.     Capillary Refill: Capillary refill takes less than 2 seconds.  Neurological:     Mental Status: She is alert and oriented to person, place, and time.  Psychiatric:        Behavior: Behavior normal.      Musculoskeletal Exam: Cervical spine was in good range of motion.  Lumbar spine was in good range of motion without any discomfort.  Shoulders, elbows, wrists, MCPs PIPs and DIPs with good range of motion with no synovitis.  Dupuytren's contracture was noted over right 4th and 5th flexor tendon.  Hip joints and knee joints in good range of motion without any warmth swelling or effusion.  There was no tenderness over ankles or MTPs.  Bilateral bunions were noted.  Overcrowding of toes was noted.  CDAI Exam: CDAI Score: -- Patient Global: --; Provider Global: -- Swollen: --; Tender:  -- Joint Exam 10/25/2023   No joint exam has been documented for this visit   There is currently no information documented on the homunculus. Go to the Rheumatology activity and complete the homunculus joint exam.  Investigation: No additional findings.  Imaging: No results found.  Recent Labs: Lab Results  Component Value Date   WBC 6.4 06/14/2023   HGB 13.2 06/14/2023   PLT 251 06/14/2023   NA 141 06/14/2023   K 4.4 06/14/2023   CL 103 06/14/2023   CO2 27 06/14/2023   GLUCOSE 92 06/14/2023   BUN 25 06/14/2023   CREATININE 0.98 06/14/2023   BILITOT 0.4 06/14/2023   ALKPHOS 64 04/08/2022   AST 23 06/14/2023   ALT 23 06/14/2023   PROT 7.1 06/14/2023   ALBUMIN 4.5 04/08/2022   CALCIUM 10.1 06/14/2023     Speciality Comments: Dxd by Dr. Urban Garden Ophthalmologist-[facility] [place] 2022 per pt.PLQ since 2017  PLQ Eye Exam: 11/05/2022 @  [address] [place], [place] Followup in 1 year  Procedures:  No procedures performed Allergies: Contrast media [iodinated contrast media], Decongestant [pseudoephedrine hcl], Iodine, Lisinopril, Macrobid [nitrofurantoin], Metrizamide, Moxifloxacin, and Prednisone   Assessment / Plan:     Visit Diagnoses: Autoimmune disease (HCC) - dxd by Dr. Raynaldo Call.  History of positive ANA, nasal ulcers, dry eyes and arthritis.  Treated with hydroxychloroquine  200 twice daily since 2017: -Patient denies having a flare of autoimmune disease.  She denies any history of oral ulcers, nasal ulcers, malar rash, photosensitivity, Raynaud's or lymphadenopathy.  There is no history of inflammatory arthritis.  Labs obtained at the last visit June 14, 2023 CBC and CMP were normal,  C3 normal C4 low at 14, double-stranded ENA negative, sed rate 6.  Urine protein creatinine ratio was normal.labs were reviewed with the patient.  Will check labs today.  Plan: Protein / creatinine ratio, urine, C3 and C4, Sedimentation rate, Anti-DNA antibody,  double-stranded  High risk medication use - Plaquenil  200 mg 1 tablet by mouth daily. PLQ Eye Exam: 11/05/2022.Labs done at her PCPs office from May 2025 include CBC, CMP, lipid panel, TSH, B12, vitamin D  and hemoglobin A1c.  Will get results from her PCPs office.  Primary osteoarthritis of both hands-she has bilateral PIP and DIP thickening.  No synovitis was  noted.  Joint protection was discussed.  Dupuytren's contracture of right hand-currently not symptomatic.  Primary osteoarthritis of both feet-she has osteoarthritis of the bilateral feet bilateral bunions with overcrowding of toes.  Toe separator was advised.  If she continues to have discomfort she might see a podiatrist.  Arthropathy of lumbar facet joint-she has intermittent discomfort.  She had good mobility without any discomfort on forward flexion.  She was able to reach her toes without any difficulty.  Core strength exercises were discussed.  Vitamin D  deficiency-she continue to take vitamin D .  Vitamin D  was normal at 58 on September 24, 2022.  DEXA is followed by her PCP.  Other medical problems are listed as follows:  History of kidney stones  History of hyperlipidemia  Essential hypertension-blood pressure was normal at 131/76.  Thyroid nodule  History of Clostridioides difficile colitis  History of gastroesophageal reflux (GERD)  Myalgia  Orders: Orders Placed This Encounter  Procedures   Protein / creatinine ratio, urine   C3 and C4   Sedimentation rate   Anti-DNA antibody, double-stranded   No orders of the defined types were placed in this encounter.    Follow-Up Instructions: Return in about 5 months (around 03/26/2024).   Nicholas Bari, MD  Note - This record has been created using Animal nutritionist.  Chart creation errors have been sought, but may not always  have been located. Such creation errors do not reflect on  the standard of medical care.

## 2023-10-25 ENCOUNTER — Encounter: Payer: Self-pay | Admitting: Rheumatology

## 2023-10-25 ENCOUNTER — Ambulatory Visit: Payer: Medicare Other | Attending: Rheumatology | Admitting: Rheumatology

## 2023-10-25 VITALS — BP 131/76 | HR 94 | Resp 13 | Ht 64.0 in | Wt 136.0 lb

## 2023-10-25 DIAGNOSIS — M720 Palmar fascial fibromatosis [Dupuytren]: Secondary | ICD-10-CM

## 2023-10-25 DIAGNOSIS — M19042 Primary osteoarthritis, left hand: Secondary | ICD-10-CM | POA: Diagnosis present

## 2023-10-25 DIAGNOSIS — Z8719 Personal history of other diseases of the digestive system: Secondary | ICD-10-CM | POA: Diagnosis present

## 2023-10-25 DIAGNOSIS — M19072 Primary osteoarthritis, left ankle and foot: Secondary | ICD-10-CM | POA: Diagnosis present

## 2023-10-25 DIAGNOSIS — M47816 Spondylosis without myelopathy or radiculopathy, lumbar region: Secondary | ICD-10-CM | POA: Diagnosis present

## 2023-10-25 DIAGNOSIS — I10 Essential (primary) hypertension: Secondary | ICD-10-CM

## 2023-10-25 DIAGNOSIS — Z8639 Personal history of other endocrine, nutritional and metabolic disease: Secondary | ICD-10-CM

## 2023-10-25 DIAGNOSIS — E041 Nontoxic single thyroid nodule: Secondary | ICD-10-CM

## 2023-10-25 DIAGNOSIS — Z87442 Personal history of urinary calculi: Secondary | ICD-10-CM | POA: Diagnosis present

## 2023-10-25 DIAGNOSIS — M359 Systemic involvement of connective tissue, unspecified: Secondary | ICD-10-CM

## 2023-10-25 DIAGNOSIS — Z79899 Other long term (current) drug therapy: Secondary | ICD-10-CM

## 2023-10-25 DIAGNOSIS — M19041 Primary osteoarthritis, right hand: Secondary | ICD-10-CM | POA: Diagnosis present

## 2023-10-25 DIAGNOSIS — E559 Vitamin D deficiency, unspecified: Secondary | ICD-10-CM

## 2023-10-25 DIAGNOSIS — Z8619 Personal history of other infectious and parasitic diseases: Secondary | ICD-10-CM | POA: Diagnosis present

## 2023-10-25 DIAGNOSIS — M19071 Primary osteoarthritis, right ankle and foot: Secondary | ICD-10-CM

## 2023-10-25 DIAGNOSIS — M791 Myalgia, unspecified site: Secondary | ICD-10-CM

## 2023-10-26 ENCOUNTER — Telehealth: Payer: Self-pay | Admitting: *Deleted

## 2023-10-26 NOTE — Telephone Encounter (Signed)
 Labs received from: [facility]  Drawn on: 09/27/2023  Reviewed by: Jacinta Martinis, PA-C  Labs drawn: Hgb A1C, Vitamin D , Vitamin B12, TSH, CBC, CMP, Lipid Panel  Results: Hgb A1C 5.8. All other labs within normal limits.

## 2023-10-27 LAB — PROTEIN / CREATININE RATIO, URINE
Creatinine, Urine: 28 mg/dL (ref 20–275)
Total Protein, Urine: <4 mg/dL — ABNORMAL LOW (ref 5–24)

## 2023-10-27 LAB — C3 AND C4
C3 Complement: 132 mg/dL (ref 83–193)
C4 Complement: 16 mg/dL (ref 15–57)

## 2023-10-27 LAB — SEDIMENTATION RATE: Sed Rate: 6 mm/h (ref 0–30)

## 2023-10-27 LAB — ANTI-DNA ANTIBODY, DOUBLE-STRANDED: ds DNA Ab: <1 [IU]/mL

## 2023-10-28 ENCOUNTER — Ambulatory Visit: Payer: Self-pay | Admitting: Rheumatology

## 2023-10-28 NOTE — Progress Notes (Signed)
 Urine protein creatinine ratio normal, complements normal, sed rate normal, double-stranded ENA negative.  Labs do not indicate an autoimmune disease flare.

## 2023-12-14 IMAGING — DX DG ABDOMEN 1V
1 series · 1 of 1 positions shown · non-contrast
Comparison: None.

CLINICAL DATA: Left lower quadrant abdominal pain, nausea,
constipation

EXAM:
ABDOMEN - 1 VIEW

[abdomen kub]
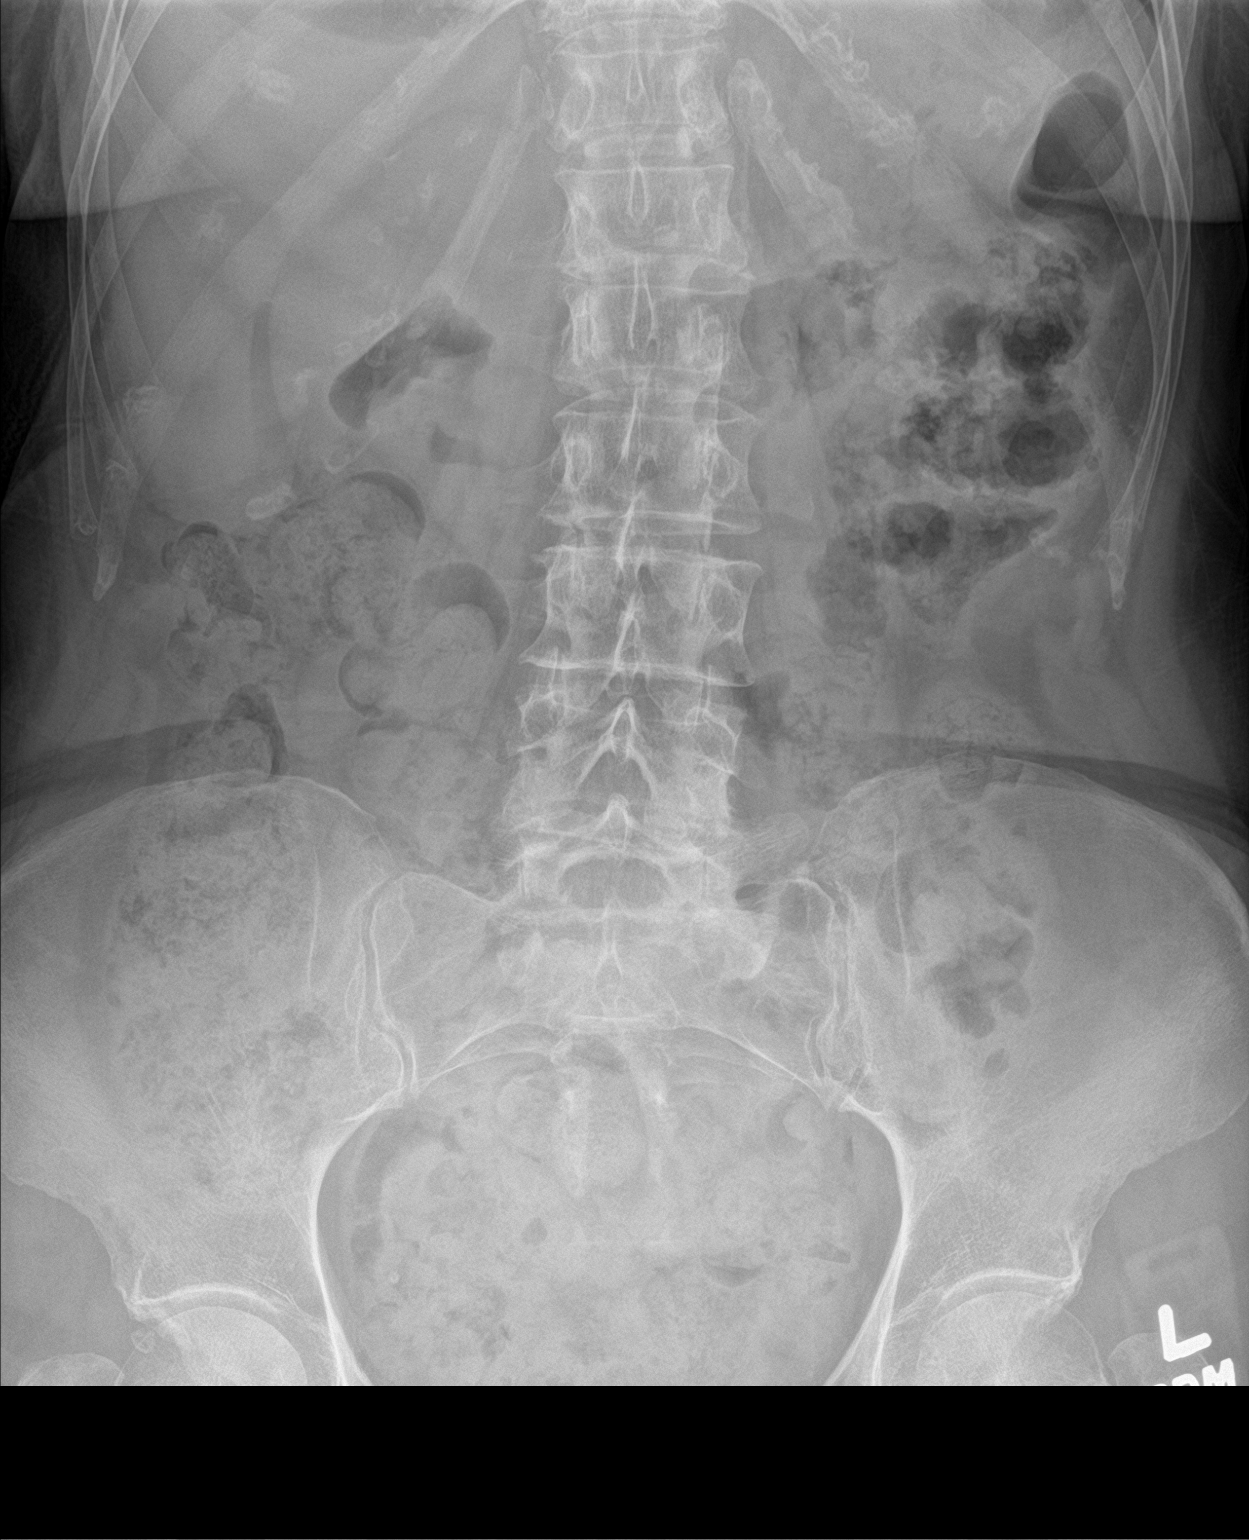

[1 of 1 positions shown; findings below may reference images not displayed]

FINDINGS: Normal abdominal gas pattern. Moderate stool seen throughout the
colon with large volume stool noted within the rectal vault. No free
intraperitoneal gas. No organomegaly. Phleboliths noted within the
right hemipelvis. No acute bone abnormality.
IMPRESSION: Normal abdominal gas pattern.  Moderate to large stool burden.

## 2024-01-13 ENCOUNTER — Other Ambulatory Visit: Payer: Self-pay | Admitting: Physician Assistant

## 2024-01-13 NOTE — Telephone Encounter (Signed)
 Last Fill: 09/05/2023  Eye exam: 11/09/2023 Normal    Labs: 09/26/2023 Hgb A1C 5.8. All other labs within normal limits.   Next Visit: 03/27/2024  Last Visit: 10/25/2023  IK:Jlunpfflwz disease   Current Dose per office note 10/25/2023: Plaquenil  200 mg 1 tablet by mouth daily   Okay to refill Plaquenil ?

## 2024-03-15 NOTE — Progress Notes (Signed)
 Office Visit Note  Patient: Carol Scott             Date of Birth: 01/11/1951           MRN: [id number]             PCP: Joella Sieving, DO Referring: Joella Sieving, DO Visit Date: 03/27/2024 Occupation: Data Unavailable  Subjective:  Pain in hands   History of Present Illness: Carol Scott is a 73 y.o. female with autoimmune disease and osteoarthritis.  She returns today after her last visit in May 2025. Patient went for physical therapy for her cervical spine.  She had a good response to dry needling which helped.  She continues to have pain and weakness in her hands.  She continues to have dry eyes. She denies any history of oral ulcers, nasal ulcers, malar rash, photosensitivity, Raynaud's, lymphadenopathy or inflammatory arthritis.  She has been taking hydroxychloroquine  200 mg daily without any interruption and has been tolerating it well.  Her last eye examination was on Nov 09, 2023.  Activities of Daily Living:  Patient reports morning stiffness for 15 minutes.   Patient Reports nocturnal pain.  Difficulty dressing/grooming: Denies Difficulty climbing stairs: Denies Difficulty getting out of chair: Denies Difficulty using hands for taps, buttons, cutlery, and/or writing: Reports  Review of Systems  Constitutional:  Negative for fatigue.  HENT:  Negative for mouth sores and mouth dryness.   Eyes:  Negative for dryness.  Respiratory:  Positive for shortness of breath.   Cardiovascular:  Negative for chest pain and palpitations.  Gastrointestinal:  Negative for blood in stool, constipation and diarrhea.  Endocrine: Negative for increased urination.  Genitourinary:  Negative for involuntary urination.  Musculoskeletal:  Positive for joint pain, gait problem, joint pain, myalgias, muscle weakness, morning stiffness, muscle tenderness and myalgias. Negative for joint swelling.  Skin:  Positive for sensitivity to sunlight. Negative for color change, rash and hair loss.   Allergic/Immunologic: Negative for susceptible to infections.  Neurological:  Positive for headaches. Negative for dizziness.  Hematological:  Negative for swollen glands.  Psychiatric/Behavioral:  Positive for sleep disturbance. Negative for depressed mood. The patient is not nervous/anxious.     PMFS History:  Patient Active Problem List   Diagnosis Date Noted   Autoimmune disease 05/14/2021   High risk medication use 05/14/2021   Primary osteoarthritis of both hands 05/14/2021   Primary osteoarthritis of both feet 05/14/2021   Arthropathy of lumbar facet joint 05/14/2021   Vitamin D  deficiency 05/14/2021   Essential hypertension 05/14/2021   History of hyperlipidemia 05/14/2021   History of gastroesophageal reflux (GERD) 05/14/2021   History of kidney stones 05/14/2021   Diarrhea 07/14/2020   Infectious colitis 07/11/2020   Common bile duct dilation 07/11/2020   Nausea without vomiting 01/01/2015   Abdominal pain, epigastric 01/01/2015    Past Medical History:  Diagnosis Date   Anal fissure    Anxiety    Cervical spondylosis without myelopathy    Connective tissue disease    rheumotology-- dr hardy ([place], [place])--- autoimmune , takes plaquenil    DDD (degenerative disc disease), cervical    GERD (gastroesophageal reflux disease)    Hemorrhoids    History of concussion    04-22-2020 per pt hit head no loc 05/ 2021, residual balance issue and headaches (headaches resolved but still have balance issues)   History of COVID-19    History of palpitations all test results in care everywhere  (04-22-2020  per pt all symptoms resolved)  per pt sent to cardiologist, dr florencio, 07/ 2021 (note in epic)  for palpatitions , poss. angia at rest, sob & r/o afib;  had 72 hour monitor (per note 04-07-2020 SR -- ST biphasic p waves no evidence afib/ flutter),  echo 03-28-2020 normal w/ ef >55%, and nuclear stress test 03-04-2020 borderline abnormal notable for poor uptake during rest ,  normal perfusion, and no evidence ischemia, ef 66%    Hypertension    followed by pcp   Mitral valve prolapse    Multiple thyroid nodules    04-22-2020  per pt has had previous bx's which were benign , followed by pcp   OA (osteoarthritis)    shoudlers, knees   Perianal pain    PONV (postoperative nausea and vomiting)    Vitamin D  deficiency    Wears glasses     Family History  Problem Relation Age of Onset   Breast cancer Mother    Diabetes Mother    Prostate cancer Father    Diabetes Father    Heart disease Father    Breast cancer Sister    Dementia Sister    Prostate cancer Brother    Breast cancer Maternal Aunt    Healthy Son    Healthy Son    Healthy Daughter    Colon cancer Neg Hx    Esophageal cancer Neg Hx    Rectal cancer Neg Hx    Stomach cancer Neg Hx    Past Surgical History:  Procedure Laterality Date   BOTOX  INJECTION N/A 04/24/2020   Procedure: POSSIBLE BOTOX  INJECTION;  Surgeon: Teresa Lonni HERO, MD;  Location: [facility];  Service: General;  Laterality: N/A;   COLONOSCOPY WITH PROPOFOL   01-04-2018  dr abran   ESOPHAGOGASTRODUODENOSCOPY (EGD) WITH PROPOFOL  N/A 10/09/2020   Procedure: ESOPHAGOGASTRODUODENOSCOPY (EGD) WITH PROPOFOL ;  Surgeon: Teressa Toribio SQUIBB, MD;  Location: [facility];  Service: Endoscopy;  Laterality: N/A;   EUS N/A 10/09/2020   Procedure: UPPER ENDOSCOPIC ULTRASOUND (EUS) RADIAL;  Surgeon: Teressa Toribio SQUIBB, MD;  Location: [facility];  Service: Endoscopy;  Laterality: N/A;   HEMORRHOID SURGERY  2005   RECTAL EXAM UNDER ANESTHESIA N/A 04/24/2020   Procedure: ANORECTAL EXAM UNDER ANESTHESIA, EXCISION OF PERIANAL LESION; BOTOX  INJECTION;  Surgeon: Teresa Lonni HERO, MD;  Location: [facility];  Service: General;  Laterality: N/A;   SUPRACERVICAL ABDOMINAL HYSTERECTOMY  2006   WITH BILATERAL SALPINGOOPHORECTOMY AND PELVIC FLOOR REPAIR (possible inculding sling)   TONSILLECTOMY AND ADENOIDECTOMY  1960    UPPER GASTROINTESTINAL ENDOSCOPY  01/06/2015   Social History   Tobacco Use   Smoking status: Former    Current packs/day: 0.00    Types: Cigarettes    Start date: 04/22/1964    Quit date: 04/22/1994    Years since quitting: 29.9    Passive exposure: Never   Smokeless tobacco: Never  Vaping Use   Vaping status: Never Used  Substance Use Topics   Alcohol use: Yes    Comment: socially   Drug use: Never   Social History   Social History Narrative   Not on file     Immunization History  Administered Date(s) Administered   Moderna Sars-Covid-2 Vaccination 08/17/2019, 09/14/2019, 05/16/2020     Objective: Vital Signs: BP 120/65   Pulse 92   Temp 98.1 F (36.7 C)   Resp 14   Ht 5' 4 (1.626 m)   Wt 133 lb 6.4 oz (60.5 kg)   BMI 22.90 kg/m  Physical Exam Vitals and nursing note reviewed.  Constitutional:      Appearance: She is well-developed.  HENT:     Head: Normocephalic and atraumatic.  Eyes:     Conjunctiva/sclera: Conjunctivae normal.  Cardiovascular:     Rate and Rhythm: Normal rate and regular rhythm.     Heart sounds: Normal heart sounds.  Pulmonary:     Effort: Pulmonary effort is normal.     Breath sounds: Normal breath sounds.  Abdominal:     General: Bowel sounds are normal.     Palpations: Abdomen is soft.  Musculoskeletal:     Cervical back: Normal range of motion.  Lymphadenopathy:     Cervical: No cervical adenopathy.  Skin:    General: Skin is warm and dry.     Capillary Refill: Capillary refill takes less than 2 seconds.  Neurological:     Mental Status: She is alert and oriented to person, place, and time.  Psychiatric:        Behavior: Behavior normal.      Musculoskeletal Exam: Cervical, thoracic and lumbar spine were in good range of motion.  There was no SI joint tenderness.  Shoulder joints, elbow joints, wrist joints, MCPs, PIPs and DIPs were in good range of motion with no synovitis.  Hip joints and knee joints were in good  range of motion without any warmth swelling or effusion.  There was no tenderness over ankles or MTPs.  Bilateral bunions and overcrowding of the toes were noted.   CDAI Exam: CDAI Score: -- Patient Global: --; Provider Global: -- Swollen: --; Tender: -- Joint Exam 03/27/2024   No joint exam has been documented for this visit   There is currently no information documented on the homunculus. Go to the Rheumatology activity and complete the homunculus joint exam.  Investigation: No additional findings.  Imaging: No results found.  Recent Labs: Lab Results  Component Value Date   WBC 6.4 06/14/2023   HGB 13.2 06/14/2023   PLT 251 06/14/2023   NA 141 06/14/2023   K 4.4 06/14/2023   CL 103 06/14/2023   CO2 27 06/14/2023   GLUCOSE 92 06/14/2023   BUN 25 06/14/2023   CREATININE 0.98 06/14/2023   BILITOT 0.4 06/14/2023   ALKPHOS 64 04/08/2022   AST 23 06/14/2023   ALT 23 06/14/2023   PROT 7.1 06/14/2023   ALBUMIN 4.5 04/08/2022   CALCIUM 10.1 06/14/2023    Speciality Comments: Dxd by Dr. Armen Ophthalmologist-[facility] [place] 2022 per pt.PLQ since 2017  PLQ Eye Exam: 11/09/2023 Normal @  [address] [place], [place] Followup in 1 year  Procedures:  No procedures performed Allergies: Contrast media [iodinated contrast media], Decongestant [pseudoephedrine hcl], Iodine, Lisinopril, Metrizamide, Moxifloxacin, Nitrofurantoin, and Prednisone   Assessment / Plan:     Visit Diagnoses: Autoimmune disease - dxd by Dr. Fae.  History of positive ANA, nasal ulcers, dry eyes and arthritis.  Treated with hydroxychloroquine  200 twice daily since 2017: -Patient denies history of oral ulcers, nasal ulcers, sicca symptoms, malar rash, photosensitivity, Raynaud's.  Her labs obtained at the last visit on 01/25/2024 were unremarkable.  I will recheck labs in 6 months.  Plan: Protein / creatinine ratio, urine, Anti-DNA antibody, double-stranded, C3 and C4, Sedimentation  rate  High risk medication use - Plaquenil  200 mg 1 tablet by mouth daily. PLQ Eye Exam: 11/09/2023 -she denies any side effects from the medication.  Will continue current treatment.  We may want to taper Plaquenil  in the future  which was also discussed.  Plan: CBC with Differential/Platelet, Comprehensive metabolic panel with GFR  Primary osteoarthritis of both hands-she had bilateral CMC PIP and DIP thickening.  Joint protection was discussed.  Dupuytren's contracture of right hand-she has Dupuytren's contractures in both hands which are asymptomatic.  Primary osteoarthritis of both feet-proper fitting shoes were advised.  Arthropathy of lumbar facet joint-she continues to have some lower back pain which is manageable.  Vitamin D  deficiency - DEXA is followed by her PCP.  Patient stated that she is supposed to schedule follow-up DEXA scan.-Will check vitamin D  level with her next labs.  Plan: VITAMIN D  25 Hydroxy (Vit-D Deficiency, Fractures)  Essential hypertension-blood pressure was normal today at 120/65.  History of kidney stones  History of hyperlipidemia  History of Clostridioides difficile colitis  Thyroid nodule  History of gastroesophageal reflux (GERD)  Myalgia-she had dry needling for cervical muscle pain which helped.  Orders: Orders Placed This Encounter  Procedures   Protein / creatinine ratio, urine   CBC with Differential/Platelet   Comprehensive metabolic panel with GFR   Anti-DNA antibody, double-stranded   C3 and C4   Sedimentation rate   VITAMIN D  25 Hydroxy (Vit-D Deficiency, Fractures)   No orders of the defined types were placed in this encounter.    Follow-Up Instructions: Return in about 6 months (around 09/25/2024) for Autoimmune disease, Osteoarthritis.   Maya Nash, MD  Note - This record has been created using Animal nutritionist.  Chart creation errors have been sought, but may not always  have been located. Such creation errors do  not reflect on  the standard of medical care.

## 2024-03-27 ENCOUNTER — Ambulatory Visit: Attending: Rheumatology | Admitting: Rheumatology

## 2024-03-27 ENCOUNTER — Encounter: Payer: Self-pay | Admitting: Rheumatology

## 2024-03-27 VITALS — BP 120/65 | HR 92 | Temp 98.1°F | Resp 14 | Ht 64.0 in | Wt 133.4 lb

## 2024-03-27 DIAGNOSIS — I10 Essential (primary) hypertension: Secondary | ICD-10-CM | POA: Insufficient documentation

## 2024-03-27 DIAGNOSIS — E559 Vitamin D deficiency, unspecified: Secondary | ICD-10-CM | POA: Insufficient documentation

## 2024-03-27 DIAGNOSIS — Z8719 Personal history of other diseases of the digestive system: Secondary | ICD-10-CM | POA: Diagnosis present

## 2024-03-27 DIAGNOSIS — Z79899 Other long term (current) drug therapy: Secondary | ICD-10-CM | POA: Insufficient documentation

## 2024-03-27 DIAGNOSIS — M19041 Primary osteoarthritis, right hand: Secondary | ICD-10-CM | POA: Insufficient documentation

## 2024-03-27 DIAGNOSIS — E041 Nontoxic single thyroid nodule: Secondary | ICD-10-CM | POA: Diagnosis present

## 2024-03-27 DIAGNOSIS — Z8639 Personal history of other endocrine, nutritional and metabolic disease: Secondary | ICD-10-CM | POA: Insufficient documentation

## 2024-03-27 DIAGNOSIS — M791 Myalgia, unspecified site: Secondary | ICD-10-CM | POA: Diagnosis present

## 2024-03-27 DIAGNOSIS — M359 Systemic involvement of connective tissue, unspecified: Secondary | ICD-10-CM | POA: Diagnosis not present

## 2024-03-27 DIAGNOSIS — M19042 Primary osteoarthritis, left hand: Secondary | ICD-10-CM | POA: Diagnosis present

## 2024-03-27 DIAGNOSIS — M720 Palmar fascial fibromatosis [Dupuytren]: Secondary | ICD-10-CM | POA: Diagnosis present

## 2024-03-27 DIAGNOSIS — Z8619 Personal history of other infectious and parasitic diseases: Secondary | ICD-10-CM | POA: Diagnosis present

## 2024-03-27 DIAGNOSIS — M19071 Primary osteoarthritis, right ankle and foot: Secondary | ICD-10-CM | POA: Diagnosis present

## 2024-03-27 DIAGNOSIS — M19072 Primary osteoarthritis, left ankle and foot: Secondary | ICD-10-CM | POA: Diagnosis present

## 2024-03-27 DIAGNOSIS — M47816 Spondylosis without myelopathy or radiculopathy, lumbar region: Secondary | ICD-10-CM | POA: Diagnosis present

## 2024-03-27 DIAGNOSIS — Z87442 Personal history of urinary calculi: Secondary | ICD-10-CM | POA: Insufficient documentation

## 2024-05-06 ENCOUNTER — Other Ambulatory Visit: Payer: Self-pay | Admitting: Physician Assistant

## 2024-05-07 ENCOUNTER — Other Ambulatory Visit: Payer: Self-pay | Admitting: Physician Assistant

## 2024-05-07 NOTE — Addendum Note (Signed)
 Addended by: CENA ALFONSO CROME on: 05/07/2024 01:24 PM   Modules accepted: Orders

## 2024-05-07 NOTE — Telephone Encounter (Signed)
 Last Fill: 01/16/2024  Eye exam: 11/09/2023 Normal    Labs: 09/27/2023 Hgb A1C 5.8. All other labs within normal limits.   Next Visit: 09/25/2024  Last Visit: 03/27/2024  IK:Jlunpfflwz disease   Current Dose per office note 03/27/2024: hydroxychloroquine  200 twice daily   Patient advised she is due to update her labs. Patient plans on going next week to update labs. Patient will call when she plans to go to have lab orders released.   Okay to refill Plaquenil ?

## 2024-05-07 NOTE — Addendum Note (Signed)
 Addended by: CENA ALFONSO CROME on: 05/07/2024 01:25 PM   Modules accepted: Orders

## 2024-05-08 ENCOUNTER — Other Ambulatory Visit: Payer: Self-pay | Admitting: Physician Assistant

## 2024-05-15 ENCOUNTER — Other Ambulatory Visit: Payer: Self-pay | Admitting: *Deleted

## 2024-05-15 ENCOUNTER — Telehealth: Payer: Self-pay | Admitting: Rheumatology

## 2024-05-15 DIAGNOSIS — M359 Systemic involvement of connective tissue, unspecified: Secondary | ICD-10-CM

## 2024-05-15 DIAGNOSIS — E559 Vitamin D deficiency, unspecified: Secondary | ICD-10-CM

## 2024-05-15 DIAGNOSIS — Z79899 Other long term (current) drug therapy: Secondary | ICD-10-CM

## 2024-05-15 NOTE — Telephone Encounter (Signed)
 Lab Orders released.

## 2024-05-15 NOTE — Telephone Encounter (Signed)
 Patient contacted the office requesting lab orders to be be release to [facility].   Patient plans to have labs on tomorrow.

## 2024-05-18 ENCOUNTER — Ambulatory Visit: Payer: Self-pay | Admitting: Rheumatology

## 2024-05-18 ENCOUNTER — Other Ambulatory Visit: Payer: Self-pay | Admitting: *Deleted

## 2024-05-18 LAB — CBC WITH DIFFERENTIAL/PLATELET
Basophils Absolute: 0 x10E3/uL (ref 0.0–0.2)
Basos: 0 %
EOS (ABSOLUTE): 0.2 x10E3/uL (ref 0.0–0.4)
Eos: 3 %
Hematocrit: 40.7 % (ref 34.0–46.6)
Hemoglobin: 13 g/dL (ref 11.1–15.9)
Immature Grans (Abs): 0 x10E3/uL (ref 0.0–0.1)
Immature Granulocytes: 0 %
Lymphocytes Absolute: 1.9 x10E3/uL (ref 0.7–3.1)
Lymphs: 26 %
MCH: 29.7 pg (ref 26.6–33.0)
MCHC: 31.9 g/dL (ref 31.5–35.7)
MCV: 93 fL (ref 79–97)
Monocytes Absolute: 0.7 x10E3/uL (ref 0.1–0.9)
Monocytes: 10 %
Neutrophils Absolute: 4.4 x10E3/uL (ref 1.4–7.0)
Neutrophils: 61 %
Platelets: 252 x10E3/uL (ref 150–450)
RBC: 4.38 x10E6/uL (ref 3.77–5.28)
RDW: 12.6 % (ref 11.7–15.4)
WBC: 7.3 x10E3/uL (ref 3.4–10.8)

## 2024-05-18 LAB — COMPREHENSIVE METABOLIC PANEL WITH GFR
ALT: 20 IU/L (ref 0–32)
AST: 28 IU/L (ref 0–40)
Albumin: 4.5 g/dL (ref 3.8–4.8)
Alkaline Phosphatase: 90 IU/L (ref 49–135)
BUN/Creatinine Ratio: 18 (ref 12–28)
BUN: 15 mg/dL (ref 8–27)
Bilirubin Total: 0.2 mg/dL (ref 0.0–1.2)
CO2: 26 mmol/L (ref 20–29)
Calcium: 9.5 mg/dL (ref 8.7–10.3)
Chloride: 101 mmol/L (ref 96–106)
Creatinine, Ser: 0.82 mg/dL (ref 0.57–1.00)
Globulin, Total: 2.4 g/dL (ref 1.5–4.5)
Glucose: 71 mg/dL (ref 70–99)
Potassium: 4 mmol/L (ref 3.5–5.2)
Sodium: 143 mmol/L (ref 134–144)
Total Protein: 6.9 g/dL (ref 6.0–8.5)
eGFR: 75 mL/min/1.73 (ref >59)

## 2024-05-18 LAB — C3 AND C4
Complement C3, Serum: 128 mg/dL (ref 82–167)
Complement C4, Serum: 15 mg/dL (ref 12–38)

## 2024-05-18 LAB — PROTEIN / CREATININE RATIO, URINE
Creatinine, Urine: 57.4 mg/dL
Protein, Ur: 5.1 mg/dL
Protein/Creat Ratio: 89 mg/g{creat} (ref 0–200)

## 2024-05-18 LAB — ANTI-DNA ANTIBODY, DOUBLE-STRANDED: dsDNA Ab: <1 [IU]/mL (ref 0–9)

## 2024-05-18 LAB — VITAMIN D 25 HYDROXY (VIT D DEFICIENCY, FRACTURES): Vit D, 25-Hydroxy: 47.5 ng/mL (ref 30.0–100.0)

## 2024-05-18 LAB — SEDIMENTATION RATE: Sed Rate: 4 mm/h (ref 0–40)

## 2024-05-18 MED ORDER — HYDROXYCHLOROQUINE SULFATE 200 MG PO TABS: 200.0000 mg | ORAL_TABLET | Freq: Every day | ORAL | 0 refills | Status: AC

## 2024-05-18 NOTE — Progress Notes (Signed)
 CBC, CMP, complements, sed rate were normal.  Vitamin D  within normal limits.

## 2024-05-18 NOTE — Telephone Encounter (Signed)
 Last Fill: 05/07/2024 (unable to get because insurance will only cover 90 day supply)  Eye exam: 11/09/2023 Normal   Labs: 05/16/2024 CBC, CMP, complements, sed rate were normal   Next Visit: 09/26/2023  Last Visit: 03/27/2024  IK:Jlunpfflwz disease   Current Dose per office note 03/27/2024: hydroxychloroquine  200 twice daily   Okay to refill Plaquenil ?

## 2024-09-25 ENCOUNTER — Ambulatory Visit: Admitting: Physician Assistant
